# Patient Record
Sex: Female | Born: 1955 | Race: White | Hispanic: No | Marital: Married | Smoking: Former smoker
Health system: Southern US, Community
[De-identification: ages and names within clinical notes are randomized; demographics above are authoritative.]

## PROBLEM LIST (undated history)

## (undated) DIAGNOSIS — E669 Obesity, unspecified: Secondary | ICD-10-CM

## (undated) DIAGNOSIS — N201 Calculus of ureter: Secondary | ICD-10-CM

## (undated) DIAGNOSIS — E7800 Pure hypercholesterolemia, unspecified: Secondary | ICD-10-CM

## (undated) DIAGNOSIS — K5792 Diverticulitis of intestine, part unspecified, without perforation or abscess without bleeding: Secondary | ICD-10-CM

## (undated) DIAGNOSIS — I350 Nonrheumatic aortic (valve) stenosis: Secondary | ICD-10-CM

## (undated) DIAGNOSIS — E119 Type 2 diabetes mellitus without complications: Secondary | ICD-10-CM

## (undated) HISTORY — DX: Nonrheumatic aortic (valve) stenosis: I35.0

---
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
## 1985-04-27 DIAGNOSIS — E7800 Pure hypercholesterolemia, unspecified: Secondary | ICD-10-CM

## 1985-04-27 HISTORY — DX: Pure hypercholesterolemia, unspecified: E78.00

## 2004-07-24 ENCOUNTER — Other Ambulatory Visit: Admission: RE | Admit: 2004-07-24 | Discharge: 2004-07-24 | Payer: Self-pay | Admitting: Gynecology

## 2005-04-24 ENCOUNTER — Encounter: Admission: RE | Admit: 2005-04-24 | Discharge: 2005-04-24 | Payer: Self-pay | Admitting: Gynecology

## 2005-05-14 ENCOUNTER — Encounter: Admission: RE | Admit: 2005-05-14 | Discharge: 2005-05-14 | Payer: Self-pay | Admitting: Gynecology

## 2005-08-06 ENCOUNTER — Other Ambulatory Visit: Admission: RE | Admit: 2005-08-06 | Discharge: 2005-08-06 | Payer: Self-pay | Admitting: Gynecology

## 2005-11-06 ENCOUNTER — Encounter: Admission: RE | Admit: 2005-11-06 | Discharge: 2005-11-06 | Payer: Self-pay | Admitting: Gynecology

## 2005-11-10 ENCOUNTER — Encounter: Admission: RE | Admit: 2005-11-10 | Discharge: 2005-11-10 | Payer: Self-pay | Admitting: Gynecology

## 2005-11-10 ENCOUNTER — Encounter (INDEPENDENT_AMBULATORY_CARE_PROVIDER_SITE_OTHER): Payer: Self-pay | Admitting: *Deleted

## 2005-11-10 HISTORY — PX: BREAST BIOPSY: SHX20

## 2005-12-10 ENCOUNTER — Encounter: Admission: RE | Admit: 2005-12-10 | Discharge: 2005-12-10 | Payer: Self-pay | Admitting: Gynecology

## 2006-04-27 HISTORY — PX: LAPAROSCOPIC HYSTERECTOMY: SHX1926

## 2006-07-08 ENCOUNTER — Encounter: Admission: RE | Admit: 2006-07-08 | Discharge: 2006-07-08 | Payer: Self-pay | Admitting: Gynecology

## 2006-07-21 ENCOUNTER — Ambulatory Visit: Payer: Self-pay | Admitting: Internal Medicine

## 2006-11-02 ENCOUNTER — Other Ambulatory Visit: Admission: RE | Admit: 2006-11-02 | Discharge: 2006-11-02 | Payer: Self-pay | Admitting: Gynecology

## 2006-11-09 ENCOUNTER — Encounter: Admission: RE | Admit: 2006-11-09 | Discharge: 2006-11-09 | Payer: Self-pay | Admitting: Gynecology

## 2006-11-17 ENCOUNTER — Encounter: Admission: RE | Admit: 2006-11-17 | Discharge: 2006-11-17 | Payer: Self-pay | Admitting: Gynecology

## 2007-02-21 ENCOUNTER — Ambulatory Visit (HOSPITAL_COMMUNITY): Admission: RE | Admit: 2007-02-21 | Discharge: 2007-02-22 | Payer: Self-pay | Admitting: Obstetrics and Gynecology

## 2007-02-21 ENCOUNTER — Encounter (INDEPENDENT_AMBULATORY_CARE_PROVIDER_SITE_OTHER): Payer: Self-pay | Admitting: Obstetrics and Gynecology

## 2007-05-31 ENCOUNTER — Encounter: Admission: RE | Admit: 2007-05-31 | Discharge: 2007-05-31 | Payer: Self-pay | Admitting: Obstetrics and Gynecology

## 2007-11-10 ENCOUNTER — Encounter: Admission: RE | Admit: 2007-11-10 | Discharge: 2007-11-10 | Payer: Self-pay | Admitting: Internal Medicine

## 2008-04-27 DIAGNOSIS — E119 Type 2 diabetes mellitus without complications: Secondary | ICD-10-CM

## 2008-04-27 HISTORY — DX: Type 2 diabetes mellitus without complications: E11.9

## 2009-02-04 ENCOUNTER — Encounter: Admission: RE | Admit: 2009-02-04 | Discharge: 2009-02-04 | Payer: Self-pay | Admitting: Internal Medicine

## 2010-02-05 ENCOUNTER — Encounter: Admission: RE | Admit: 2010-02-05 | Discharge: 2010-02-05 | Payer: Self-pay | Admitting: Internal Medicine

## 2010-05-18 ENCOUNTER — Encounter: Payer: Self-pay | Admitting: Gynecology

## 2010-07-08 ENCOUNTER — Other Ambulatory Visit: Payer: Self-pay | Admitting: Internal Medicine

## 2010-07-08 DIAGNOSIS — R1013 Epigastric pain: Secondary | ICD-10-CM

## 2010-07-08 DIAGNOSIS — R1011 Right upper quadrant pain: Secondary | ICD-10-CM

## 2010-07-09 ENCOUNTER — Ambulatory Visit
Admission: RE | Admit: 2010-07-09 | Discharge: 2010-07-09 | Disposition: A | Discharge status: Home / Self Care | Payer: BC Managed Care – PPO | Source: Ambulatory Visit | Attending: Internal Medicine | Admitting: Internal Medicine

## 2010-07-09 DIAGNOSIS — R1011 Right upper quadrant pain: Secondary | ICD-10-CM

## 2010-07-09 DIAGNOSIS — R1013 Epigastric pain: Secondary | ICD-10-CM

## 2010-09-09 NOTE — Discharge Summary (Signed)
NAMEANALYSSE, QUINONEZ             ACCOUNT NO.:  [id number]   MEDICAL RECORD NO.:  [id number]          PATIENT TYPE:  OIB   LOCATION:  9319                          FACILITY:  [facility]   PHYSICIAN:  Guy Sandifer. Henderson Cloud, M.D. DATE OF BIRTH:  [birth year]   DATE OF ADMISSION:  02/21/2007  DATE OF DISCHARGE:  02/22/2007                               DISCHARGE SUMMARY   ADMITTING DIAGNOSIS:  Endometrial hyperplasia with atypia.   DISCHARGE DIAGNOSIS:  Endometrial hyperplasia with atypia.   PROCEDURE:  On February 21, 2007 laparoscopically-assisted vaginal  hysterectomy with bilateral salpingo-oophorectomy.   REASON FOR ADMISSION:  The patient is a 55 year old, married, white  female, G2, P2, status post tubal ligation with atypical endometrial  hyperplasia on biopsy.  Details are dictated in the history and  physical.  She is admitted for surgical management.   HOSPITAL COURSE:  The patient was admitted to the hospital and undergoes  the above procedure.  Estimated blood loss is 200 mL.  On the evening of  surgery, she has good pain relief.  Vital signs are stable and she is  afebrile with clear urine output.  On the day of discharge she is  passing flatus, tolerating a regular diet, voiding, ambulating.  Vital  signs are stable and she is afebrile.  Hemoglobin is 10.2.   CONDITION ON DISCHARGE:  Good.   DIET:  Regular as tolerated.   ACTIVITY:  No lifting, no operation of automobiles, no vaginal entry.  She is to call the office for problems including not limited to  increasing pain, persistent nausea or vomiting, heavy bleeding or  temperature of 101 degrees.   MEDICATIONS:  1. Percocet 5/325 mg #40 1-2 p.o. q.6 h p.r.n.  2. Ibuprofen 600 mg q.6 h p.r.n.  3. Multivitamin daily.   FOLLOW-UP:  In the office in 2 weeks.      Guy Sandifer Henderson Cloud, M.D.  Electronically Signed     JET/MEDQ  D:  02/22/2007  T:  02/22/2007  Job:  [id number]

## 2010-09-09 NOTE — H&P (Signed)
NAMECASSARA, [name]             ACCOUNT NO.:  [id number]   MEDICAL RECORD NO.:  [id number]          PATIENT TYPE:  AMB   LOCATION:  SDC                           FACILITY:  [facility]   PHYSICIAN:  Guy Sandifer. Henderson Cloud, M.D. DATE OF BIRTH:  July 09, 1955   DATE OF ADMISSION:  02/21/2007  DATE OF DISCHARGE:                              HISTORY & PHYSICAL   CHIEF COMPLAINT:  Endometrial hyperplasia with atypia.   HISTORY OF PRESENT ILLNESS:  This patient is a 55 year old married white  female, G2, P2, status post tubal ligation who on recent endometrial  biopsy had hyperplasia with atypia.  Ultrasound done at the time of the  biopsy also revealed a 1.7 cm simple cyst of the right ovary.  Pathology  was consistent with focal complex endometrial hyperplasia with atypia.  After discussion of the options, she is being admitted for  laparoscopically-assisted vaginal hysterectomy and bilateral salpingo-  oophorectomy.  Possible risks and complications have been reviewed  preoperatively.  Issues of menopause have also been reviewed.   PAST MEDICAL HISTORY:  1. Hyperlipidemia.  2. History of yeast infections.  3. History of anemia.   PAST SURGICAL HISTORY:  Tonsillectomy.   OBSTETRIC HISTORY:  Cesarean section x2.   FAMILY HISTORY:  Cancer in mother, chronic hypertension in mother, heart  surgery.   SOCIAL HISTORY:  Ethanol on a social basis.  Denies tobacco or drug  abuse.   MEDICATIONS:  1. Crestor 20 mg daily.  2. Zetia 10 mg daily.   ALLERGIES:  PENICILLIN with a question of leading to rash on one  occasion.  No history of similar reactions before.   REVIEW OF SYSTEMS:  NEURO:  Denies headache.  CARDIAC:  Denies chest  pain.  PULMONARY:  Denies shortness of breath.  GI:  Denies recent  changes in bowel habits.   PHYSICAL EXAM:  Height 5 feet 0 inches, weight 159 pounds, blood  pressure 122/84.  HEENT:  Without thyromegaly.  LUNGS:  Clear to auscultation.  HEART:  Regular rate and  rhythm.  BACK:  Without CVA tenderness.  BREASTS:  Without mass, retraction or discharge.  ABDOMEN:  Soft, nontender without masses.  PELVIC EXAM:  vulva, vagina and cervix without lesion.  Uterus normal  size, mobile, nontender.  Adnexa:  Nontender without masses.  EXTREMITIES:  Grossly within normal limits.  NEUROLOGICAL EXAM:  Grossly within normal limits.   ASSESSMENT:  Focal complex endometrial hyperplasia with atypia.   PLAN:  Laparoscopically-assisted vaginal hysterectomy with bilateral  salpingo-oophorectomy.      Guy Sandifer Henderson Cloud, M.D.  Electronically Signed     JET/MEDQ  D:  55/16/2008  T:  02/11/2007  Job:  [id number]

## 2010-09-09 NOTE — Op Note (Signed)
NAMEALIANY, FIORENZA             ACCOUNT NO.:  [id number]   MEDICAL RECORD NO.:  [id number]          PATIENT TYPE:  AMB   LOCATION:  SDC                           FACILITY:  [facility]   PHYSICIAN:  Guy Sandifer. Henderson Cloud, M.D. DATE OF BIRTH:  12-07-1955   DATE OF PROCEDURE:  02/21/2007  DATE OF DISCHARGE:                               OPERATIVE REPORT   PREOPERATIVE DIAGNOSIS:  Atypical endometrial hyperplasia.   POSTOPERATIVE DIAGNOSIS:  Atypical endometrial hyperplasia.   PROCEDURE:  Laparoscopically-assisted vaginal hysterectomy with  bilateral salpingo-oophorectomy.   SURGEON:  Harold Hedge, MD.   ASSISTANT:  Zelphia Cairo, MD   ANESTHESIA:  General endotracheal intubation.   SPECIMENS:  Uterus, bilateral tubes and ovaries to pathology.   ESTIMATED BLOOD LOSS:  200 mL.   INDICATIONS AND CONSENT:  The patient is a 55 year old married white  female G2, P2, status post tubal ligation with atypical endometrial  hyperplasia.  Details dictated in history and physical.  Laparoscopically-assisted vaginal hysterectomy and removal of both  ovaries is discussed preoperatively.  Potential risks and complications  have been reviewed preoperatively including but not limited to  infection, organ damage, bleeding and transfusion with HIV and hepatitis  risk, DVT, PE, pneumonia, fistula formation, laparotomy, issues of  menopause.  All questions were answered and consent is signed on the  chart.   FINDINGS:  Upper abdomen is grossly normal.  Uterus is normal in size  and contour.  Anterior posterior cul-de-sacs were normal.  Ovaries are  normal bilaterally.   PROCEDURE:  The patient taken to operating room where she is identified,  placed in dorsosupine position and general anesthesia is induced via  endotracheal intubation.  She is then placed in dorsal lithotomy  position where she is prepped abdominally and vaginally bladder straight  catheterized.  Hulka tenaculum was placed in uterus  as a manipulator and  she is draped in sterile fashion.  The infraumbilical and suprapubic  areas injected the midline with 1/2% plain Marcaine.  Small  infraumbilical incision is made.  A disposable Veress needle was placed  on first attempt with a normal syringe and drop test noted.  2 liters of  gas were insufflated under low pressure with good tympany in the right  upper quadrant.  Veress needle is removed and a 10/11 XL bladeless  disposable trocar sleeve was placed using direct visualization with the  diagnostic laparoscope.  After placement, the operative laparoscope was  placed and a small suprapubic incision is made in the midline.  A 5-mm  XL bladeless disposable trocar sleeve is placed under direct  visualization without difficulty.  The above findings noted.  Ureters  were seen to be well clear.  Then using the gyrus bipolar cautery  cutting instrument the right infundibulopelvic ligament is taken down.  It is taken across the mesosalpinx, across the round ligament and down  to the level vesicouterine peritoneum.  Good hemostasis is maintained.  Similar procedure is carried out on the left side.  The bladder flap is  taken down cephalolaterally.  The suprapubic trocar sleeve was removed.  Instruments are removed.  Attention is  turned attention is turned to the  vagina.  Posterior cul-de-sac is entered sharply and the cervix was  circumscribed with unipolar cautery.  Mucosa is advanced sharply and  bluntly.  Then using the gyrus bipolar cautery instrument the  uterosacral ligaments are taken down followed by the bladder pillars,  cardinal ligaments and uterine vessels bilaterally.  Anterior cul-de-sac  is entered without difficulty.  Fundus is delivered posteriorly and the  specimen is fully delivered.  Uterosacral ligaments are plicated the  vaginal cuff bilaterally with separate sutures of 0 Monocryl.  All  suture be 0 Monocryl unless otherwise designated.  Uterosacral  ligaments  then plicated midline with a third suture.  Cuff was closed with figure-  of-eights.  Foley catheter is placed in the bladder and clear urine is  noted.  Attention is returned to the abdomen.  After the  pneumoperitoneum was recreated, the suprapubic trocar sleeve is  reinserted under direct visualization.  Careful inspection and  irrigation reveals excellent hemostasis all around.  Excess fluid is  removed and inspection again under reduced pneumoperitoneum reveals  continued hemostasis.  All instruments are removed.  Incisions were  closed with Dermabond.  All counts were correct.  The patient is  awakened, taken to recovery room in stable condition.      Guy Sandifer Henderson Cloud, M.D.  Electronically Signed     JET/MEDQ  D:  02/21/2007  T:  02/21/2007  Job:  [id number]

## 2010-11-27 ENCOUNTER — Other Ambulatory Visit: Payer: Self-pay | Admitting: Gastroenterology

## 2011-01-30 ENCOUNTER — Other Ambulatory Visit: Payer: Self-pay | Admitting: Internal Medicine

## 2011-01-30 DIAGNOSIS — Z1231 Encounter for screening mammogram for malignant neoplasm of breast: Secondary | ICD-10-CM

## 2011-02-04 LAB — CBC
HCT: 29.8 — ABNORMAL LOW
Hemoglobin: 13.3
MCHC: 33
MCV: 85.8
Platelets: 137 — ABNORMAL LOW
RBC: 4.7
WBC: 9.4

## 2011-02-04 LAB — COMPREHENSIVE METABOLIC PANEL
ALT: 30
CO2: 28
Calcium: 10.3
GFR calc non Af Amer: >60
Glucose, Bld: 123 — ABNORMAL HIGH
Sodium: 140
Total Bilirubin: 0.5

## 2011-02-25 ENCOUNTER — Ambulatory Visit
Admission: RE | Admit: 2011-02-25 | Discharge: 2011-02-25 | Disposition: A | Discharge status: Home / Self Care | Payer: BC Managed Care – PPO | Source: Ambulatory Visit | Attending: Internal Medicine | Admitting: Internal Medicine

## 2011-02-25 DIAGNOSIS — Z1231 Encounter for screening mammogram for malignant neoplasm of breast: Secondary | ICD-10-CM

## 2012-01-21 ENCOUNTER — Other Ambulatory Visit: Payer: Self-pay | Admitting: Internal Medicine

## 2012-01-21 DIAGNOSIS — Z1231 Encounter for screening mammogram for malignant neoplasm of breast: Secondary | ICD-10-CM

## 2012-02-26 ENCOUNTER — Ambulatory Visit
Admission: RE | Admit: 2012-02-26 | Discharge: 2012-02-26 | Disposition: A | Discharge status: Home / Self Care | Payer: BC Managed Care – PPO | Source: Ambulatory Visit | Attending: Internal Medicine | Admitting: Internal Medicine

## 2012-02-26 DIAGNOSIS — Z1231 Encounter for screening mammogram for malignant neoplasm of breast: Secondary | ICD-10-CM

## 2013-02-13 ENCOUNTER — Other Ambulatory Visit: Payer: Self-pay

## 2013-02-13 DIAGNOSIS — Z1231 Encounter for screening mammogram for malignant neoplasm of breast: Secondary | ICD-10-CM

## 2013-03-13 ENCOUNTER — Ambulatory Visit
Admission: RE | Admit: 2013-03-13 | Discharge: 2013-03-13 | Disposition: A | Discharge status: Home / Self Care | Payer: BC Managed Care – PPO | Source: Ambulatory Visit

## 2013-03-13 DIAGNOSIS — Z1231 Encounter for screening mammogram for malignant neoplasm of breast: Secondary | ICD-10-CM

## 2014-05-02 ENCOUNTER — Other Ambulatory Visit: Payer: Self-pay

## 2014-05-02 DIAGNOSIS — Z1231 Encounter for screening mammogram for malignant neoplasm of breast: Secondary | ICD-10-CM

## 2014-05-10 ENCOUNTER — Ambulatory Visit
Admission: RE | Admit: 2014-05-10 | Discharge: 2014-05-10 | Disposition: A | Discharge status: Home / Self Care | Payer: BLUE CROSS/BLUE SHIELD | Source: Ambulatory Visit

## 2014-05-10 DIAGNOSIS — Z1231 Encounter for screening mammogram for malignant neoplasm of breast: Secondary | ICD-10-CM

## 2015-04-28 DIAGNOSIS — I10 Essential (primary) hypertension: Secondary | ICD-10-CM

## 2015-04-28 HISTORY — DX: Essential (primary) hypertension: I10

## 2015-06-26 DIAGNOSIS — N201 Calculus of ureter: Secondary | ICD-10-CM

## 2015-06-26 DIAGNOSIS — K5792 Diverticulitis of intestine, part unspecified, without perforation or abscess without bleeding: Secondary | ICD-10-CM

## 2015-06-26 HISTORY — DX: Calculus of ureter: N20.1

## 2015-06-26 HISTORY — DX: Diverticulitis of intestine, part unspecified, without perforation or abscess without bleeding: K57.92

## 2015-06-30 ENCOUNTER — Encounter (HOSPITAL_COMMUNITY): Payer: Self-pay | Admitting: Emergency Medicine

## 2015-06-30 ENCOUNTER — Emergency Department (HOSPITAL_COMMUNITY): Payer: BLUE CROSS/BLUE SHIELD

## 2015-06-30 ENCOUNTER — Inpatient Hospital Stay (HOSPITAL_COMMUNITY)
Admission: EM | Admit: 2015-06-30 | Discharge: 2015-07-03 | DRG: 690 | Disposition: A | Discharge status: Home / Self Care | Payer: BLUE CROSS/BLUE SHIELD | Attending: Internal Medicine | Admitting: Internal Medicine

## 2015-06-30 DIAGNOSIS — Z809 Family history of malignant neoplasm, unspecified: Secondary | ICD-10-CM | POA: Diagnosis not present

## 2015-06-30 DIAGNOSIS — Z833 Family history of diabetes mellitus: Secondary | ICD-10-CM

## 2015-06-30 DIAGNOSIS — Z6835 Body mass index (BMI) 35.0-35.9, adult: Secondary | ICD-10-CM

## 2015-06-30 DIAGNOSIS — N390 Urinary tract infection, site not specified: Principal | ICD-10-CM | POA: Diagnosis present

## 2015-06-30 DIAGNOSIS — Z87891 Personal history of nicotine dependence: Secondary | ICD-10-CM

## 2015-06-30 DIAGNOSIS — E669 Obesity, unspecified: Secondary | ICD-10-CM | POA: Diagnosis present

## 2015-06-30 DIAGNOSIS — Z79899 Other long term (current) drug therapy: Secondary | ICD-10-CM

## 2015-06-30 DIAGNOSIS — K5792 Diverticulitis of intestine, part unspecified, without perforation or abscess without bleeding: Secondary | ICD-10-CM | POA: Diagnosis present

## 2015-06-30 DIAGNOSIS — N1330 Unspecified hydronephrosis: Secondary | ICD-10-CM | POA: Diagnosis present

## 2015-06-30 DIAGNOSIS — K5712 Diverticulitis of small intestine without perforation or abscess without bleeding: Secondary | ICD-10-CM | POA: Diagnosis not present

## 2015-06-30 DIAGNOSIS — N201 Calculus of ureter: Secondary | ICD-10-CM | POA: Diagnosis not present

## 2015-06-30 DIAGNOSIS — E7800 Pure hypercholesterolemia, unspecified: Secondary | ICD-10-CM | POA: Diagnosis present

## 2015-06-30 DIAGNOSIS — E119 Type 2 diabetes mellitus without complications: Secondary | ICD-10-CM | POA: Diagnosis not present

## 2015-06-30 DIAGNOSIS — B9620 Unspecified Escherichia coli [E. coli] as the cause of diseases classified elsewhere: Secondary | ICD-10-CM | POA: Diagnosis present

## 2015-06-30 DIAGNOSIS — N132 Hydronephrosis with renal and ureteral calculous obstruction: Secondary | ICD-10-CM | POA: Diagnosis present

## 2015-06-30 DIAGNOSIS — R109 Unspecified abdominal pain: Secondary | ICD-10-CM | POA: Diagnosis not present

## 2015-06-30 DIAGNOSIS — N179 Acute kidney failure, unspecified: Secondary | ICD-10-CM | POA: Diagnosis present

## 2015-06-30 HISTORY — DX: Obesity, unspecified: E66.9

## 2015-06-30 HISTORY — DX: Diverticulitis of intestine, part unspecified, without perforation or abscess without bleeding: K57.92

## 2015-06-30 HISTORY — DX: Calculus of ureter: N20.1

## 2015-06-30 HISTORY — DX: Type 2 diabetes mellitus without complications: E11.9

## 2015-06-30 HISTORY — DX: Pure hypercholesterolemia, unspecified: E78.00

## 2015-06-30 LAB — COMPREHENSIVE METABOLIC PANEL
ALBUMIN: 4 g/dL (ref 3.5–5.0)
ALK PHOS: 50 U/L (ref 38–126)
ALT: 23 U/L (ref 14–54)
ANION GAP: 8 (ref 5–15)
AST: 23 U/L (ref 15–41)
BUN: 20 mg/dL (ref 6–20)
CO2: 23 mmol/L (ref 22–32)
Calcium: 9.7 mg/dL (ref 8.9–10.3)
Chloride: 105 mmol/L (ref 101–111)
Creatinine, Ser: 1.11 mg/dL — ABNORMAL HIGH (ref 0.44–1.00)
GFR calc Af Amer: >60 mL/min (ref >60)
GFR calc non Af Amer: 53 mL/min — ABNORMAL LOW (ref >60)
GLUCOSE: 196 mg/dL — AB (ref 65–99)
POTASSIUM: 4.1 mmol/L (ref 3.5–5.1)
SODIUM: 136 mmol/L (ref 135–145)
Total Bilirubin: 0.6 mg/dL (ref 0.3–1.2)
Total Protein: 7 g/dL (ref 6.5–8.1)

## 2015-06-30 LAB — URINALYSIS, ROUTINE W REFLEX MICROSCOPIC
BILIRUBIN URINE: NEGATIVE
GLUCOSE, UA: NEGATIVE mg/dL
Ketones, ur: NEGATIVE mg/dL
Nitrite: POSITIVE — AB
PH: 5 (ref 5.0–8.0)
Protein, ur: NEGATIVE mg/dL
SPECIFIC GRAVITY, URINE: 1.023 (ref 1.005–1.030)

## 2015-06-30 LAB — LIPASE, BLOOD: Lipase: 29 U/L (ref 11–51)

## 2015-06-30 LAB — GLUCOSE, CAPILLARY
GLUCOSE-CAPILLARY: 140 mg/dL — AB (ref 65–99)
Glucose-Capillary: 171 mg/dL — ABNORMAL HIGH (ref 65–99)
Glucose-Capillary: 164 mg/dL — ABNORMAL HIGH (ref 65–99)

## 2015-06-30 LAB — CBC
HEMATOCRIT: 36.5 % (ref 36.0–46.0)
HEMOGLOBIN: 12.2 g/dL (ref 12.0–15.0)
MCH: 29 pg (ref 26.0–34.0)
MCHC: 33.4 g/dL (ref 30.0–36.0)
MCV: 86.7 fL (ref 78.0–100.0)
Platelets: 267 10*3/uL (ref 150–400)
RBC: 4.21 MIL/uL (ref 3.87–5.11)
RDW: 14.3 % (ref 11.5–15.5)
WBC: 15.8 10*3/uL — ABNORMAL HIGH (ref 4.0–10.5)

## 2015-06-30 LAB — URINE MICROSCOPIC-ADD ON

## 2015-06-30 MED ORDER — SODIUM CHLORIDE 0.9 % IV BOLUS (SEPSIS)
1000.0000 mL | Freq: Once | INTRAVENOUS | Status: AC
  Administered 2015-06-30: 1000 mL via INTRAVENOUS

## 2015-06-30 MED ORDER — METRONIDAZOLE IN NACL 5-0.79 MG/ML-% IV SOLN
500.0000 mg | Freq: Once | INTRAVENOUS | Status: AC
  Administered 2015-06-30: 500 mg via INTRAVENOUS
  Filled 2015-06-30: qty 100

## 2015-06-30 MED ORDER — ONDANSETRON HCL 4 MG/2ML IJ SOLN
4.0000 mg | Freq: Once | INTRAMUSCULAR | Status: DC
  Filled 2015-06-30: qty 2

## 2015-06-30 MED ORDER — HYDROMORPHONE HCL 1 MG/ML IJ SOLN: 1.0000 mg | INTRAMUSCULAR | Status: AC | PRN

## 2015-06-30 MED ORDER — ACETAMINOPHEN 325 MG PO TABS
650.0000 mg | ORAL_TABLET | Freq: Four times a day (QID) | ORAL | Status: DC | PRN
  Administered 2015-07-01 – 2015-07-03 (×3): 650 mg via ORAL
  Filled 2015-06-30 (×4): qty 2

## 2015-06-30 MED ORDER — CIPROFLOXACIN IN D5W 400 MG/200ML IV SOLN
400.0000 mg | Freq: Once | INTRAVENOUS | Status: AC
  Administered 2015-06-30: 400 mg via INTRAVENOUS
  Filled 2015-06-30: qty 200

## 2015-06-30 MED ORDER — ONDANSETRON HCL 4 MG/2ML IJ SOLN
4.0000 mg | Freq: Once | INTRAMUSCULAR | Status: AC
  Administered 2015-06-30: 4 mg via INTRAVENOUS
  Filled 2015-06-30: qty 2

## 2015-06-30 MED ORDER — MORPHINE SULFATE (PF) 4 MG/ML IV SOLN
4.0000 mg | Freq: Once | INTRAVENOUS | Status: AC
  Administered 2015-06-30: 4 mg via INTRAVENOUS
  Filled 2015-06-30: qty 1

## 2015-06-30 MED ORDER — ACETAMINOPHEN 650 MG RE SUPP
650.0000 mg | Freq: Four times a day (QID) | RECTAL | Status: DC | PRN
  Administered 2015-06-30: 650 mg via RECTAL
  Filled 2015-06-30: qty 1

## 2015-06-30 MED ORDER — DEXTROSE 5 % IV SOLN
1.0000 g | Freq: Once | INTRAVENOUS | Status: DC
  Filled 2015-06-30: qty 10

## 2015-06-30 MED ORDER — MORPHINE SULFATE (PF) 4 MG/ML IV SOLN: 4.0000 mg | Freq: Once | INTRAVENOUS | Status: DC

## 2015-06-30 MED ORDER — DEXTROSE 5 % IV SOLN
1.0000 g | INTRAVENOUS | Status: DC
  Administered 2015-06-30 – 2015-07-02 (×3): 1 g via INTRAVENOUS
  Filled 2015-06-30 (×4): qty 10

## 2015-06-30 MED ORDER — ONDANSETRON HCL 4 MG/2ML IJ SOLN
4.0000 mg | Freq: Four times a day (QID) | INTRAMUSCULAR | Status: DC | PRN
  Administered 2015-06-30: 4 mg via INTRAVENOUS
  Filled 2015-06-30: qty 2

## 2015-06-30 MED ORDER — METRONIDAZOLE IN NACL 5-0.79 MG/ML-% IV SOLN: 500.0000 mg | Freq: Three times a day (TID) | INTRAVENOUS | Status: DC

## 2015-06-30 MED ORDER — CIPROFLOXACIN IN D5W 400 MG/200ML IV SOLN: 400.0000 mg | Freq: Two times a day (BID) | INTRAVENOUS | Status: DC

## 2015-06-30 MED ORDER — IOHEXOL 300 MG/ML  SOLN
100.0000 mL | Freq: Once | INTRAMUSCULAR | Status: AC | PRN
  Administered 2015-06-30: 100 mL via INTRAVENOUS

## 2015-06-30 MED ORDER — INSULIN ASPART 100 UNIT/ML ~~LOC~~ SOLN
0.0000 [IU] | Freq: Three times a day (TID) | SUBCUTANEOUS | Status: DC
  Administered 2015-06-30: 1 [IU] via SUBCUTANEOUS
  Administered 2015-06-30: 2 [IU] via SUBCUTANEOUS
  Administered 2015-07-01 – 2015-07-02 (×4): 1 [IU] via SUBCUTANEOUS
  Administered 2015-07-02 (×2): 2 [IU] via SUBCUTANEOUS
  Administered 2015-07-03: 1 [IU] via SUBCUTANEOUS

## 2015-06-30 MED ORDER — TAMSULOSIN HCL 0.4 MG PO CAPS
0.4000 mg | ORAL_CAPSULE | Freq: Every day | ORAL | Status: DC
  Administered 2015-06-30 – 2015-07-02 (×3): 0.4 mg via ORAL
  Filled 2015-06-30 (×3): qty 1

## 2015-06-30 MED ORDER — MORPHINE SULFATE (PF) 2 MG/ML IV SOLN
1.0000 mg | INTRAVENOUS | Status: DC | PRN
  Administered 2015-06-30 (×2): 1 mg via INTRAVENOUS
  Filled 2015-06-30 (×2): qty 1

## 2015-06-30 MED ORDER — ENOXAPARIN SODIUM 40 MG/0.4ML ~~LOC~~ SOLN
40.0000 mg | Freq: Every day | SUBCUTANEOUS | Status: DC
  Administered 2015-06-30 – 2015-07-02 (×3): 40 mg via SUBCUTANEOUS
  Filled 2015-06-30 (×5): qty 0.4

## 2015-06-30 MED ORDER — ONDANSETRON HCL 4 MG PO TABS: 4.0000 mg | ORAL_TABLET | Freq: Four times a day (QID) | ORAL | Status: DC | PRN

## 2015-06-30 MED ORDER — INSULIN ASPART 100 UNIT/ML ~~LOC~~ SOLN: 0.0000 [IU] | Freq: Every day | SUBCUTANEOUS | Status: DC

## 2015-06-30 MED ORDER — SODIUM CHLORIDE 0.9 % IV SOLN
INTRAVENOUS | Status: DC
Start: 2015-06-30 — End: 2015-07-01
  Administered 2015-06-30: 10:00:00 via INTRAVENOUS

## 2015-06-30 MED ORDER — ONDANSETRON HCL 4 MG/2ML IJ SOLN
4.0000 mg | Freq: Three times a day (TID) | INTRAMUSCULAR | Status: AC | PRN
Start: 2015-06-30 — End: 2015-06-30

## 2015-06-30 MED ORDER — METRONIDAZOLE IN NACL 5-0.79 MG/ML-% IV SOLN
500.0000 mg | Freq: Three times a day (TID) | INTRAVENOUS | Status: DC
  Administered 2015-06-30 – 2015-07-03 (×9): 500 mg via INTRAVENOUS
  Filled 2015-06-30 (×9): qty 100

## 2015-06-30 NOTE — Consult Note (Signed)
Urology Consult  CC: Referring physician: Dr. Wendy Poet Reason for referral: Right distal ureteral stone with hydronephrosis and possible UTI.  History of Present Illness: [name] is a 60 year old female who presented to the emergency room with a 24-hour history of right lower quadrant pain. She indicated that she was having pain in her abdomen on the left-hand side yesterday but that resolved and this morning was having pain in the right lower quadrant. It was constant and did not radiate into the flank or genital region. It is moderate in severity and was not relieved by positional change. She reports that she has never had a kidney stone in the past. She denies any change in her voiding pattern and specifically denies any dysuria or hematuria. She does report she has had UTIs in the past. She has not experienced difficulty with pyelonephritis.    Past Medical History  Diagnosis Date  . High cholesterol   . Diabetes mellitus without complication (HCC)   . Diverticulitis     06/2015  . Ureteral stone     06/2015   Past Surgical History  Procedure Laterality Date  . Cesarean section      Medications:  Scheduled: . cefTRIAXone (ROCEPHIN)  IV  1 g Intravenous Q24H  . enoxaparin (LOVENOX) injection  40 mg Subcutaneous Daily  . insulin aspart  0-5 Units Subcutaneous QHS  . insulin aspart  0-9 Units Subcutaneous TID WC  . metronidazole  500 mg Intravenous Q8H  . ondansetron (ZOFRAN) IV  4 mg Intravenous Once    Allergies:  Allergies  Allergen Reactions  . Penicillins Anaphylaxis    Red blotches     Family History  Problem Relation Age of Onset  . Hyperlipidemia Mother   . High Cholesterol Mother   . Diabetes Mother   . Cancer Mother   . Cancer Father   . Diabetes Sister     Social History:  reports that she has quit smoking. She does not have any smokeless tobacco history on file. She reports that she drinks about 1.2 oz of alcohol per week. She reports that she  does not use illicit drugs.  Review of Systems: Pertinent items are noted in HPI. A comprehensive review of systems was negative except As noted above  Physical Exam:  Vital signs in last 24 hours: Temp:  [99.1 F (37.3 C)] 99.1 F (37.3 C) (03/05 0451) Pulse Rate:  [86-94] 86 (03/05 0728) Resp:  [20] 20 (03/05 0728) BP: (142-145)/(63-69) 142/63 mmHg (03/05 0728) SpO2:  [96 %-97 %] 96 % (03/05 0728) Weight:  [82.555 kg (182 lb)] 82.555 kg (182 lb) (03/05 0459) General appearance: alert and appears stated age Head: Normocephalic, without obvious abnormality, atraumatic Eyes: conjunctivae/corneas clear. EOM's intact.  Oropharynx: moist mucous membranes Neck: supple, symmetrical, trachea midline Resp: normal respiratory effort Cardio: regular rate and rhythm Back: symmetric, no curvature. ROM normal. No CVA tenderness. GI: soft, tender in the right lower quadrent with no peritoneal signs; bowel sounds normal; no masses,  no organomegaly Extremities: extremities normal, atraumatic, no cyanosis or edema Skin: Skin color normal. No visible rashes or lesions Neurologic: Grossly normal  Laboratory Data:   Recent Labs  06/30/15 0508  WBC 15.8*  HGB 12.2  HCT 36.5   BMET  Recent Labs  06/30/15 0508  NA 136  K 4.1  CL 105  CO2 23  GLUCOSE 196*  BUN 20  CREATININE 1.11*  CALCIUM 9.7   No results for input(s): LABPT, INR in the last  72 hours. No results for input(s): LABURIN in the last 72 hours. No results found for this or any previous visit. Creatinine:  Recent Labs  06/30/15 0508  CREATININE 1.11*    Imaging: Ct Abdomen Pelvis W Contrast  06/30/2015  CLINICAL DATA:  Mainly right lower quadrant abdominal pain since 3 a.m. EXAM: CT ABDOMEN AND PELVIS WITH CONTRAST TECHNIQUE: Multidetector CT imaging of the abdomen and pelvis was performed using the standard protocol following bolus administration of intravenous contrast. CONTRAST:  100mL OMNIPAQUE IOHEXOL 300  MG/ML  SOLN COMPARISON:  07/09/2010 ultrasound FINDINGS: Lower chest:  Negative Hepatobiliary: Mild diffuse hepatic steatosis.  Gallbladder normal. Pancreas: Normal Spleen: Normal Adrenals/Urinary Tract: Adrenal glands are normal. Left kidney is normal. The right kidney demonstrates moderate hydronephrosis. There is severe dilatation of the right ureter to a maximal diameter of 14 mm. 1 cm proximal to the ureterovesical junction there is a stone in the distal right ureter. It measures 6 mm. Bladder is negative. Stomach/Bowel: Stomach small bowel and appendix are normal. There is mild diverticulosis of the distal half of the sigmoid colon. The distal sigmoid colon demonstrates pronounced wall thickening and surrounding inflammation. There is no evidence of free air for abscess to indicate perforation. There is trace free fluid left pelvic sidewall. Vascular/Lymphatic: Small pericolonic lymph nodes are seen in the region of the inflamed sigmoid colon measuring up to about 5 mm in short axis. There is atherosclerotic aortoiliac calcification. Reproductive: Uterus is absent.  No pelvic masses. Other: None Musculoskeletal: No acute findings IMPRESSION: There appear to be two coexisting acute abnormalities. There is severe right-sided hydronephrosis due to a stone in the distal right ureter. Additionally, there is moderate to severe inflammatory change involving the distal sigmoid colon over a distance of about 10 cm. This is likely due to diverticulitis, although endoscopic evaluation after appropriate therapy to exclude underlying abnormality would be suggested. Electronically Signed   By: Esperanza Heiraymond  Rubner M.D.   On: 06/30/2015 07:34   CT scan images were independently reviewed  Impression/Assessment:  1. Right distal ureteral stone - the patient has a 4 mm right distal ureteral stone. It is 4 mm in width despite the radiologist reading of maximum diameter of 14 mm which appears to be in error. There is proximal  hydronephrosis. There are no renal calculi noted on the right or left sides. She is having minimal discomfort in the right flank.I noted there is no periureteral or perinephric stranding to suggest an inflammatory process proximal to the stone such as pyelonephritis. Due to the degree of dilation of the collecting system it appears that this stone has likely been present in the distal ureter for some time although I do not appreciate any significant loss of renal parenchyma on the right-hand side. In addition her creatinine at 1.11 is noted to be completely normal. Her white blood cell count is slightly elevated at 15.8 but she appears to have an active inflammatory process occurring in the abdomen. I have discussed with her the fact that her stone has a high probability of spontaneous passage based on its size. She is not having any flank pain and does not appear to be symptomatic from her stone and it is my opinion that her right lower quadrant pain is not due to her distal ureteral stone. We did discuss the need for stent placement if she should have symptoms suggestive of infection proximal to the stone or clinical worsening otherwise. I also have discussed with her that I will begin  her on medical expulsive therapy in order to improve the odds of spontaneous passage of her stone and if it does not pass spontaneously then we will discuss either ureteroscopic management or lithotripsy.  2. Possible UTI: - Her urine had too numerous to count white blood cells and was nitrite positive with 6-30 RBCs and many bacteria although it appeared contaminated with 6-30 squamous cells noted as well. Although she does have a past history of UTIs she reported to me today that she has no symptoms to suggest a UTI. Her urine will need to be cultured and she is being placed on empiric antibiotics.  It is my opinion that her right-sided abdominal pain is due to the inflammatory process involving her distal sigmoid  colon.  Plan:  1. Culture urine.  2. Antibiotics. 3. Medical expulsive therapy using tamsulosin 0.4 mg daily. 4. Will follow and assist as needed. 4. Strain all urine.  Ronetta Molla C 06/30/2015, 8:03 AM

## 2015-06-30 NOTE — ED Provider Notes (Addendum)
CSN: [id number]     Arrival date & time 06/30/15  0445 History   First MD Initiated Contact with Patient 06/30/15 [phone]     Chief Complaint  Patient presents with  . Abdominal Pain     (Consider location/radiation/quality/duration/timing/severity/associated sxs/prior Treatment) Patient is a 60 y.o. female presenting with abdominal pain. The history is provided by the patient.  Abdominal Pain Associated symptoms: no chest pain, no chills, no cough, no diarrhea, no dysuria, no fever, no shortness of breath, no sore throat, no vaginal bleeding, no vaginal discharge and no vomiting   Patient c/o right abd pain onset this AM. States yesterday had a pain mid to left abd. That pain is somewhat improved today. todays pain right abd, acute onset, constant, moderate, non radiating. No specific exacerbating or alleviating factors. Had normal appetite yesterday. Mild nausea now. No vomiting. No diarrhea.  Remote hx c section, no other abd surgery. Denies hx gallstones. No dysuria or hematuria. No back or flank pain. Denies fever or chills. No cough or chest pain. No sob.      Past Medical History  Diagnosis Date  . High cholesterol   . Diabetes mellitus without complication [facility](HCC)    Past Surgical History  Procedure Laterality Date  . Cesarean section     Family History  Problem Relation Age of Onset  . Hyperlipidemia Mother   . High Cholesterol Mother   . Diabetes Mother   . Cancer Mother   . Cancer Father   . Diabetes Sister    Social History  Substance Use Topics  . Smoking status: Former Games developermoker  . Smokeless tobacco: None  . Alcohol Use: 1.2 oz/week    2 Glasses of wine per week     Comment: on the weekends   OB History    No data available     Review of Systems  Constitutional: Negative for fever and chills.  HENT: Negative for sore throat.   Eyes: Negative for redness.  Respiratory: Negative for cough and shortness of breath.   Cardiovascular: Negative for chest pain and  leg swelling.  Gastrointestinal: Positive for abdominal pain. Negative for vomiting and diarrhea.  Genitourinary: Negative for dysuria, flank pain, vaginal bleeding and vaginal discharge.  Musculoskeletal: Negative for back pain and neck pain.  Skin: Negative for rash.  Neurological: Negative for headaches.  Hematological: Does not bruise/bleed easily.  Psychiatric/Behavioral: Negative for confusion.      Allergies  Penicillins  Home Medications   Prior to Admission medications   Not on File   BP 145/69 mmHg  Pulse 94  Temp(Src) 99.1 F (37.3 C) (Oral)  Resp 20  Ht 5' (1.524 m)  Wt 82.555 kg  BMI 35.54 kg/m2  SpO2 97% Physical Exam  Constitutional: She appears well-developed and well-nourished. No distress.  HENT:  Mouth/Throat: Oropharynx is clear and moist.  Eyes: Conjunctivae are normal. No scleral icterus.  Neck: Neck supple. No tracheal deviation present.  Cardiovascular: Normal rate, regular rhythm, normal heart sounds and intact distal pulses.  Exam reveals no gallop and no friction rub.   No murmur heard. Pulmonary/Chest: Effort normal and breath sounds normal. No respiratory distress.  Abdominal: Soft. Normal appearance and bowel sounds are normal. She exhibits no distension and no mass. There is tenderness. There is no rebound and no guarding.  Left abdominal tenderness. (note pts acute/severe pain is right abd, but minimal right tenderness).     Genitourinary:  No cva tenderness  Musculoskeletal: She exhibits no edema.  Neurological:  She is alert.  Skin: Skin is warm and dry. No rash noted. She is not diaphoretic.  No rash/lesions in area of pain  Psychiatric: She has a normal mood and affect.  Nursing note and vitals reviewed.   ED Course  Procedures (including critical care time) Labs Review  Results for orders placed or performed during the hospital encounter of 06/30/15  Lipase, blood  Result Value Ref Range   Lipase 29 11 - 51 U/L   Comprehensive metabolic panel  Result Value Ref Range   Sodium 136 135 - 145 mmol/L   Potassium 4.1 3.5 - 5.1 mmol/L   Chloride 105 101 - 111 mmol/L   CO2 23 22 - 32 mmol/L   Glucose, Bld 196 (H) 65 - 99 mg/dL   BUN 20 6 - 20 mg/dL   Creatinine, Ser 1.61 (H) 0.44 - 1.00 mg/dL   Calcium 9.7 8.9 - 09.6 mg/dL   Total Protein 7.0 6.5 - 8.1 g/dL   Albumin 4.0 3.5 - 5.0 g/dL   AST 23 15 - 41 U/L   ALT 23 14 - 54 U/L   Alkaline Phosphatase 50 38 - 126 U/L   Total Bilirubin 0.6 0.3 - 1.2 mg/dL   GFR calc non Af Amer 53 (L) >60 mL/min   GFR calc Af Amer >60 >60 mL/min   Anion gap 8 5 - 15  CBC  Result Value Ref Range   WBC 15.8 (H) 4.0 - 10.5 K/uL   RBC 4.21 3.87 - 5.11 MIL/uL   Hemoglobin 12.2 12.0 - 15.0 g/dL   HCT 04.5 40.9 - 81.1 %   MCV 86.7 78.0 - 100.0 fL   MCH 29.0 26.0 - 34.0 pg   MCHC 33.4 30.0 - 36.0 g/dL   RDW 91.4 78.2 - 95.6 %   Platelets 267 150 - 400 K/uL  Urinalysis, Routine w reflex microscopic (not at [facility])  Result Value Ref Range   Color, Urine YELLOW YELLOW   APPearance TURBID (A) CLEAR   Specific Gravity, Urine 1.023 1.005 - 1.030   pH 5.0 5.0 - 8.0   Glucose, UA NEGATIVE NEGATIVE mg/dL   Hgb urine dipstick MODERATE (A) NEGATIVE   Bilirubin Urine NEGATIVE NEGATIVE   Ketones, ur NEGATIVE NEGATIVE mg/dL   Protein, ur NEGATIVE NEGATIVE mg/dL   Nitrite POSITIVE (A) NEGATIVE   Leukocytes, UA LARGE (A) NEGATIVE  Urine microscopic-add on  Result Value Ref Range   Squamous Epithelial / LPF 6-30 (A) NONE SEEN   WBC, UA TOO NUMEROUS TO COUNT 0 - 5 WBC/hpf   RBC / HPF 6-30 0 - 5 RBC/hpf   Bacteria, UA MANY (A) NONE SEEN   Ct Abdomen Pelvis W Contrast  06/30/2015  CLINICAL DATA:  Mainly right lower quadrant abdominal pain since 3 a.m. EXAM: CT ABDOMEN AND PELVIS WITH CONTRAST TECHNIQUE: Multidetector CT imaging of the abdomen and pelvis was performed using the standard protocol following bolus administration of intravenous contrast. CONTRAST:  OMNIPAQUE  IOHEXOL 300 MG/ML  SOLN COMPARISON:  07/09/2010 ultrasound FINDINGS: Lower chest:  Negative Hepatobiliary: Mild diffuse hepatic steatosis.  Gallbladder normal. Pancreas: Normal Spleen: Normal Adrenals/Urinary Tract: Adrenal glands are normal. Left kidney is normal. The right kidney demonstrates moderate hydronephrosis. There is severe dilatation of the right ureter to a maximal diameter of 14 mm. 1 cm proximal to the ureterovesical junction there is a stone in the distal right ureter. It measures 6 mm. Bladder is negative. Stomach/Bowel: Stomach small bowel and appendix are normal. There  is mild diverticulosis of the distal half of the sigmoid colon. The distal sigmoid colon demonstrates pronounced wall thickening and surrounding inflammation. There is no evidence of free air for abscess to indicate perforation. There is trace free fluid left pelvic sidewall. Vascular/Lymphatic: Small pericolonic lymph nodes are seen in the region of the inflamed sigmoid colon measuring up to about 5 mm in short axis. There is atherosclerotic aortoiliac calcification. Reproductive: Uterus is absent.  No pelvic masses. Other: None Musculoskeletal: No acute findings IMPRESSION: There appear to be two coexisting acute abnormalities. There is severe right-sided hydronephrosis due to a stone in the distal right ureter. Additionally, there is moderate to severe inflammatory change involving the distal sigmoid colon over a distance of about 10 cm. This is likely due to diverticulitis, although endoscopic evaluation after appropriate therapy to exclude underlying abnormality would be suggested. Electronically Signed   By: Esperanza Heir M.D.   On: 06/30/2015 07:34       I have personally reviewed and evaluated these images and lab results as part of my medical decision-making.   MDM   Iv ns. Labs.   Morphine for pain. zofran iv.  Ct.  Reviewed nursing notes and prior charts for additional history.   ua positive. u cx  sent.  Although ua is positive, pt demonstrates no real cva tenderness/pain . Given persistent abd pain, moving location, etc, will get imaging.  Pt requests additional pain med. Morphine iv.   Pt indicates only allergy is pcn, and that she had mild red rash to lower legs, which she feels was not a reaction to pcn.  There is no hx anaphylactic rxn.  Ct results noted. ?acute diverticultis, and right ureteral stone w sev hydro. Also w infected stone/uti.  Will rx cipro and flagyl iv.   Urology consulted re uti w ureteral stone. Discussed with Dr Vernie Ammons - he will see/consult.  Medical service contacted for admission. APP indicates place admit/obs temp orders under Dr Lafe Garin.        Cathren Laine, MD 06/30/15 [phone]

## 2015-06-30 NOTE — ED Notes (Signed)
Pt coming from home with ABD pain. ABD pain started approx 3 am yesterday morning. LBM yesterday. Pain went away and then came back. RLQ pain. Denies N/V/D. No appetitie changes. Denies cold and flu symptoms. Pt describes pain as stabbing pain.

## 2015-06-30 NOTE — H&P (Signed)
[facility] History and Physical  [name] ZOX:[id number] DOB: [birth year] DOA: 06/30/2015  Referring physician: [place] Constable PCP: No primary care provider on file. [place]  Chief Complaint: abdominal pain  HPI: [name] is a very pleasant 60 y.o. female 's medical history that includes diabetes diet controlled, high cholesterol, obesity presents to the urgency department with chief complaint of abdominal pain. Initial evaluation reveals diverticulitis as well as right ureteral stone with hydronephrosis.  Information is obtained from the patient. She reports being in her usual state of health until yesterday afternoon she developed left-sided lower quadrant abdominal pain. She describes the pain is sharp intermittent and rates it about a 5 out of 10. At this point she denies any nausea vomiting diarrhea. She denies any fever chills headache dizziness syncope or near-syncope. She reports she is able to tolerate nourishment. He complained of some mild constipation. tHis morning she awakened with right-sided abdominal pain to currently mid quadrants. in addition to the left-sided pain. She describes this pain as a dull ache like constant and rates it about a 7 out of 10. This morning she is also developed some intermittent nausea but denies any vomiting. She denies dysuria hematuria frequency or urgency. She denies fever chills.  In the emergency department max temperature is 99.1 she is hemodynamically stable and not hypoxic. In the emergency department she received IV fluids Cipro and Flagyl as well as morphine for pain.  Review of Systems:  10 point review of systems complete and all systems are negative except as indicated in the history of present illness  Past Medical History  Diagnosis Date  . High cholesterol   . Diabetes mellitus without complication (HCC)   . Diverticulitis     06/2015  . Ureteral stone     06/2015  . Obesity    Past Surgical History  Procedure  Laterality Date  . Cesarean section     Social History:  reports that she has quit smoking. She does not have any smokeless tobacco history on file. She reports that she drinks about 1.2 oz of alcohol per week. She reports that she does not use illicit drugs. She lives at home with her husband has done so for the last 20 years. She is employed full-time as an Geophysicist/field seismologist to a Veterinary surgeon. She drinks 2-3 glasses of wine 3 times a week Allergies  Allergen Reactions  . Penicillins Anaphylaxis    Red blotches     Family History  Problem Relation Age of Onset  . Hyperlipidemia Mother   . High Cholesterol Mother   . Diabetes Mother   . Cancer Mother   . Cancer Father   . Diabetes Sister      Prior to Admission medications   Medication Sig Start Date End Date Taking? Authorizing Provider  aspirin EC 81 MG tablet Take 81 mg by mouth daily.   Yes Historical Provider, MD  calcium carbonate (OSCAL) 1500 (600 Ca) MG TABS tablet Take 1,500 mg by mouth daily with breakfast.   Yes Historical Provider, MD  cholecalciferol (VITAMIN D) 1000 units tablet Take 4,000 Units by mouth daily.   Yes Historical Provider, MD  fenofibrate 160 MG tablet Take 160 mg by mouth daily.   Yes Historical Provider, MD  Lactobacillus (PROBIOTIC ACIDOPHILUS PO) Take 1 tablet by mouth daily.   Yes Historical Provider, MD  Multiple Vitamin (MULTIVITAMIN WITH MINERALS) TABS tablet Take 1 tablet by mouth daily.   Yes Historical Provider, MD  omega-3 acid ethyl esters (LOVAZA) 1  g capsule Take 2 g by mouth daily.   Yes Historical Provider, MD  simvastatin (ZOCOR) 80 MG tablet Take 80 mg by mouth daily.   Yes Historical Provider, MD   Physical Exam: Filed Vitals:   06/30/15 0451 06/30/15 0459 06/30/15 0728 06/30/15 0815  BP: 145/69  142/63 137/79  Pulse: 94  86 87  Temp: 99.1 F (37.3 C)     TempSrc: Oral     Resp: 20  20 20   Height:  5' (1.524 m)    Weight:  82.555 kg (182 lb)    SpO2: 97%  96% 93%    Wt Readings from  Last 3 Encounters:  06/30/15 82.555 kg (182 lb)    General:  Appears calm, Only slightly uncomfortable Eyes: PERRL, normal lids, irises & conjunctiva ENT: grossly normal hearing, lips & tongue mucous membranes of her mouth are pink slightly dry Neck: no LAD, masses or thyromegaly Cardiovascular: Tachycardic but regular no m/r/g. No LE edema.  Respiratory: CTA bilaterally, no w/r/r. Normal respiratory effort. Abdomen: soft, ntnd obese only mild tenderness on right side. Nontender on left no guarding sluggish bowel sounds Skin: no rash or induration seen on limited exam Musculoskeletal: grossly normal tone BUE/BLE Psychiatric: grossly normal mood and affect, speech fluent and appropriate Neurologic: grossly non-focal. Speech clear facial symmetry           Labs on Admission:  Basic Metabolic Panel:  Recent Labs Lab 06/30/15 0508  NA 136  K 4.1  CL 105  CO2 23  GLUCOSE 196*  BUN 20  CREATININE 1.11*  CALCIUM 9.7   Liver Function Tests:  Recent Labs Lab 06/30/15 0508  AST 23  ALT 23  ALKPHOS 50  BILITOT 0.6  PROT 7.0  ALBUMIN 4.0    Recent Labs Lab 06/30/15 0508  LIPASE 29   No results for input(s): AMMONIA in the last 168 hours. CBC:  Recent Labs Lab 06/30/15 0508  WBC 15.8*  HGB 12.2  HCT 36.5  MCV 86.7  PLT 267   Cardiac Enzymes: No results for input(s): CKTOTAL, CKMB, CKMBINDEX, TROPONINI in the last 168 hours.  BNP (last 3 results) No results for input(s): BNP in the last 8760 hours.  ProBNP (last 3 results) No results for input(s): PROBNP in the last 8760 hours.  CBG: No results for input(s): GLUCAP in the last 168 hours.  Radiological Exams on Admission: Ct Abdomen Pelvis W Contrast  06/30/2015  CLINICAL DATA:  Mainly right lower quadrant abdominal pain since 3 a.m. EXAM: CT ABDOMEN AND PELVIS WITH CONTRAST TECHNIQUE: Multidetector CT imaging of the abdomen and pelvis was performed using the standard protocol following bolus  administration of intravenous contrast. CONTRAST:  100mL OMNIPAQUE IOHEXOL 300 MG/ML  SOLN COMPARISON:  07/09/2010 ultrasound FINDINGS: Lower chest:  Negative Hepatobiliary: Mild diffuse hepatic steatosis.  Gallbladder normal. Pancreas: Normal Spleen: Normal Adrenals/Urinary Tract: Adrenal glands are normal. Left kidney is normal. The right kidney demonstrates moderate hydronephrosis. There is severe dilatation of the right ureter to a maximal diameter of 14 mm. 1 cm proximal to the ureterovesical junction there is a stone in the distal right ureter. It measures 6 mm. Bladder is negative. Stomach/Bowel: Stomach small bowel and appendix are normal. There is mild diverticulosis of the distal half of the sigmoid colon. The distal sigmoid colon demonstrates pronounced wall thickening and surrounding inflammation. There is no evidence of free air for abscess to indicate perforation. There is trace free fluid left pelvic sidewall. Vascular/Lymphatic: Small pericolonic lymph nodes  are seen in the region of the inflamed sigmoid colon measuring up to about 5 mm in short axis. There is atherosclerotic aortoiliac calcification. Reproductive: Uterus is absent.  No pelvic masses. Other: None Musculoskeletal: No acute findings IMPRESSION: There appear to be two coexisting acute abnormalities. There is severe right-sided hydronephrosis due to a stone in the distal right ureter. Additionally, there is moderate to severe inflammatory change involving the distal sigmoid colon over a distance of about 10 cm. This is likely due to diverticulitis, although endoscopic evaluation after appropriate therapy to exclude underlying abnormality would be suggested. Electronically Signed   By: Esperanza Heir M.D.   On: 06/30/2015 07:34    EKG: pending  Assessment/Plan Principal Problem:   Abdominal pain Active Problems:   Diabetes mellitus without complication (HCC)   High cholesterol   Diverticulitis   Ureteral stone    Hydronephrosis   UTI (lower urinary tract infection)   Acute kidney injury (HCC)   Obesity  #1. Abdominal pain. Likely related to ureteral stone with hydronephrosis on the right and diverticulitis  moderate sigmoid colon wall thickening and surrounding inflammation per CT. mAx temp 99.1 mildly tachycardic hemodynamically stable and nontoxic appearing -Admit to MedSurg -Rocephin and Flagyl -Nothing by mouth until evaluated by urology -Supportive therapy in the form of analgesia and anti-emetic -Gentle IV fluids -Monitor urine output  #2. UTI -See #1 -Urine culture -Rocephin  #3. Acute kidney injury. Creatinine 1.11 on admission Likely related to above -Hold nephrotoxins -Gentle IV fluids -See #1  #4. Diabetes. Diet controlled. Serum glucose 196 admission. -Nothing by mouth evaluated by urology -Clear liquids when appropriate -Advanced a carb modified -Obtain a hemoglobin A1c -Riding scale insulin for optimal control  #5. High cholesterol. Home medications include Zocor -We'll hold Zocor for now do to above -Obtain a lipid panel  #7. Obesity. BMI 35.6 -Nutritional consult   urology    Code Status: full DVT Prophylaxis: Family Communication: husband at bedside Disposition Plan: home hopefully 48 hours  Time spent: 45 minutes  [facility]

## 2015-06-30 NOTE — Progress Notes (Signed)
PHARMACY - CEFTRIAXONE  A: 60 y/o female admitted with abdominal pain found to have diverticulitis and R ureteral stone with hydronephrosis. Started on Cipro and Flagyl. Pharmacy consulted to change to ceftriaxone for UTI. WBC elevated. SCr 1.11, CrCl ~51 ml/min. UA dirty. UCx pending.  Noted PCN allergy of anaphylaxis - confirmed with patient no SOB, lip/tongue swelling, ONLY rash - updated allergies  Cipro x1 3/5 Flagyl 3/5 >> Ceftriaxone 3/5>>  P: Ceftriaxone 1 g IV q24h Pharmacy signing off as does not need to be renally adjusted  [facility] PetersburgJennifer Independence, 17[address] Rainbow BoulevardPharm.D., BCPS Clinical Pharmacist Pager: [phone] 06/30/2015 9:08 AM

## 2015-06-30 NOTE — ED Notes (Signed)
Called out requesting something for nausea.  Order received and med taken to room.  On arrival to room states it passed can I have some water.  Water provided with okay from physician.

## 2015-06-30 NOTE — ED Notes (Signed)
Taken to CT at this time. 

## 2015-07-01 DIAGNOSIS — E119 Type 2 diabetes mellitus without complications: Secondary | ICD-10-CM

## 2015-07-01 DIAGNOSIS — N201 Calculus of ureter: Secondary | ICD-10-CM

## 2015-07-01 DIAGNOSIS — K5792 Diverticulitis of intestine, part unspecified, without perforation or abscess without bleeding: Secondary | ICD-10-CM

## 2015-07-01 DIAGNOSIS — K5712 Diverticulitis of small intestine without perforation or abscess without bleeding: Secondary | ICD-10-CM

## 2015-07-01 LAB — GLUCOSE, CAPILLARY
GLUCOSE-CAPILLARY: 150 mg/dL — AB (ref 65–99)
Glucose-Capillary: 143 mg/dL — ABNORMAL HIGH (ref 65–99)
Glucose-Capillary: 147 mg/dL — ABNORMAL HIGH (ref 65–99)
Glucose-Capillary: 146 mg/dL — ABNORMAL HIGH (ref 65–99)

## 2015-07-01 LAB — BASIC METABOLIC PANEL
Anion gap: 9 (ref 5–15)
BUN: 18 mg/dL (ref 6–20)
CHLORIDE: 109 mmol/L (ref 101–111)
CO2: 22 mmol/L (ref 22–32)
Calcium: 8.6 mg/dL — ABNORMAL LOW (ref 8.9–10.3)
Creatinine, Ser: 1.49 mg/dL — ABNORMAL HIGH (ref 0.44–1.00)
GFR calc Af Amer: 43 mL/min — ABNORMAL LOW (ref >60)
GFR, EST NON AFRICAN AMERICAN: 37 mL/min — AB (ref >60)
GLUCOSE: 142 mg/dL — AB (ref 65–99)
POTASSIUM: 3.8 mmol/L (ref 3.5–5.1)
Sodium: 140 mmol/L (ref 135–145)

## 2015-07-01 LAB — CBC
HEMATOCRIT: 30.2 % — AB (ref 36.0–46.0)
HEMOGLOBIN: 10 g/dL — AB (ref 12.0–15.0)
MCH: 29 pg (ref 26.0–34.0)
MCHC: 33.1 g/dL (ref 30.0–36.0)
MCV: 87.5 fL (ref 78.0–100.0)
Platelets: 185 10*3/uL (ref 150–400)
RBC: 3.45 MIL/uL — ABNORMAL LOW (ref 3.87–5.11)
RDW: 14.8 % (ref 11.5–15.5)
WBC: 17.2 10*3/uL — ABNORMAL HIGH (ref 4.0–10.5)

## 2015-07-01 LAB — HEMOGLOBIN A1C
Hgb A1c MFr Bld: 7.5 % — ABNORMAL HIGH (ref 4.8–5.6)
Mean Plasma Glucose: 169 mg/dL

## 2015-07-01 MED ORDER — FLORA-Q PO CAPS
ORAL_CAPSULE | Freq: Every day | ORAL | Status: DC
  Filled 2015-07-01: qty 1

## 2015-07-01 MED ORDER — FENOFIBRATE 160 MG PO TABS
160.0000 mg | ORAL_TABLET | Freq: Every day | ORAL | Status: DC
  Administered 2015-07-01 – 2015-07-03 (×3): 160 mg via ORAL
  Filled 2015-07-01 (×3): qty 1

## 2015-07-01 MED ORDER — ATORVASTATIN CALCIUM 40 MG PO TABS
40.0000 mg | ORAL_TABLET | Freq: Every day | ORAL | Status: DC
  Administered 2015-07-01 – 2015-07-02 (×2): 40 mg via ORAL
  Filled 2015-07-01 (×2): qty 1

## 2015-07-01 MED ORDER — SODIUM CHLORIDE 0.9 % IV SOLN
INTRAVENOUS | Status: DC
  Administered 2015-07-01: 1000 mL via INTRAVENOUS
  Administered 2015-07-02: 75 mL/h via INTRAVENOUS

## 2015-07-01 MED ORDER — SACCHAROMYCES BOULARDII 250 MG PO CAPS
250.0000 mg | ORAL_CAPSULE | Freq: Two times a day (BID) | ORAL | Status: DC
  Administered 2015-07-01 – 2015-07-03 (×5): 250 mg via ORAL
  Filled 2015-07-01 (×5): qty 1

## 2015-07-01 MED ORDER — RISAQUAD PO CAPS
1.0000 | ORAL_CAPSULE | Freq: Every day | ORAL | Status: DC
  Administered 2015-07-01 – 2015-07-03 (×3): 1 via ORAL
  Filled 2015-07-01 (×4): qty 1

## 2015-07-01 NOTE — Progress Notes (Signed)
Patient ID: [name], female   DOB: 1956-01-11, 60 y.o.   MRN: [id number]  Subjective: Patient reports that her right lower quadrant pain has nearly resolved. She said she is now feeling a little discomfort in her right flank but it is not severe in any way. She has noted some increased urinary frequency but is on intravenous fluids. She has not seen her stone pass and indicates that her urine has not been strained as I had ordered. She is not having any fever or chills. She said overall she feels much better. She said there was rather sudden improvement in her pain yesterday afternoon.  Objective: Vital signs in last 24 hours: Temp:  [98.3 F (36.8 C)-103 F (39.4 C)] 98.3 F (36.8 C) (03/06 0533) Pulse Rate:  [81-109] 88 (03/06 0533) Resp:  [16-20] 16 (03/06 0533) BP: (94-137)/(37-79) 112/62 mmHg (03/06 0533) SpO2:  [93 %-98 %] 94 % (03/06 0533) Weight:  [84.1 kg (185 lb 6.5 oz)] 84.1 kg (185 lb 6.5 oz) (03/05 0859)A  Intake/Output from previous day: 03/05 0701 - 03/06 0700 In: 927.5 [I.V.:677.5; IV Piggyback:250] Out: -  Intake/Output this shift:    Past Medical History  Diagnosis Date  . High cholesterol   . Diabetes mellitus without complication (HCC)   . Diverticulitis     06/2015  . Ureteral stone     06/2015  . Obesity     Physical Exam:  Lungs - Normal respiratory effort, chest expands symmetrically.  Abdomen - Soft, Only slightly tender in the right lower quadrant with no peritoneal signs & non-distended. She has no CVAT on the right-hand side.  Lab Results:  Recent Labs  06/30/15 0508 07/01/15 0528  WBC 15.8* 17.2*  HGB 12.2 10.0*  HCT 36.5 30.2*   BMET  Recent Labs  06/30/15 0508 07/01/15 0528  NA 136 140  K 4.1 3.8  CL 105 109  CO2 23 22  GLUCOSE 196* 142*  BUN 20 18  CREATININE 1.11* 1.49*  CALCIUM 9.7 8.6*   No results for input(s): LABURIN in the last 72 hours. No results found for this or any previous  visit.  Studies/Results: No results found.  Assessment: Clinically she has experienced marked improvement. Her pain has improved significantly and she feels much better overall. She is having some irritative voiding symptoms primarily frequency which could be due to the stone in her distal ureter having moved somewhat closer to the bladder but also could be secondary to the intravenous fluids she has been receiving. Her urine has not been strained as I ordered so it is possible she may have passed her stone although for now I will continue to monitor.  Plan: 1. Strain urine. 2. Continue medical expulsive therapy with tamsulosin. 3. We will have her follow-up as an outpatient for further imaging and management of her stone if it persists at that time.  [name] C 07/01/2015, 7:31 AM

## 2015-07-01 NOTE — Consult Note (Signed)
Referring Provider:  Dr. Susa Raring Primary Care Physician:  Dr. Lonzo Cloud Primary Gastroenterologist:  Dr. Dulce Sellar  Reason for Consultation:  Diverticulitis  HPI: [name] is a 60 y.o. female admitted to the hospital yesterday following a one-day history of significant lower abdominal pain which began in the left lower quadrant and then migrated to the right lower quadrant. The pain is doing better at this time. CT scan on admission showed evidence of 2 simultaneous acute abnormalities, sigmoid diverticulitis and right-sided hydronephrosis due to a stone.  Of note, the patient has never previously had either diverticulitis or a kidney stone, and her colonoscopy for screening in August 2012 (Dr. Willis Modena) did not make any mention of diverticulosis, although the prep was suboptimal. Nonetheless, the CT scan shows appears to be mild distal sigmoid diverticular change.  The patient is on Rocephin and Flagyl, and has been afebrile and without need for pain medication for about 24 hours. However, despite this clinical improvement, there has been a slight rise in the patient's white count overnight, from 15.8 thousand to a current level of 17.2 thousand. For that reason, we were asked to see the patient.   Past Medical History  Diagnosis Date  . High cholesterol   . Diabetes mellitus without complication (HCC)   . Diverticulitis     06/2015  . Ureteral stone     06/2015  . Obesity     Past Surgical History  Procedure Laterality Date  . Cesarean section      Prior to Admission medications   Medication Sig Start Date End Date Taking? Authorizing Provider  aspirin EC 81 MG tablet Take 81 mg by mouth daily.   Yes Historical Provider, MD  calcium carbonate (OSCAL) 1500 (600 Ca) MG TABS tablet Take 1,500 mg by mouth daily with breakfast.   Yes Historical Provider, MD  cholecalciferol (VITAMIN D) 1000 units tablet Take 4,000 Units by mouth daily.   Yes Historical Provider, MD   fenofibrate 160 MG tablet Take 160 mg by mouth daily.   Yes Historical Provider, MD  Lactobacillus (PROBIOTIC ACIDOPHILUS PO) Take 1 tablet by mouth daily.   Yes Historical Provider, MD  Multiple Vitamin (MULTIVITAMIN WITH MINERALS) TABS tablet Take 1 tablet by mouth daily.   Yes Historical Provider, MD  omega-3 acid ethyl esters (LOVAZA) 1 g capsule Take 2 g by mouth daily.   Yes Historical Provider, MD  simvastatin (ZOCOR) 80 MG tablet Take 80 mg by mouth daily.   Yes Historical Provider, MD    Current Facility-Administered Medications  Medication Dose Route Frequency Provider Last Rate Last Dose  . 0.9 %  sodium chloride infusion   Intravenous Continuous Leroy Sea, MD 75 mL/hr at 07/01/15 0802 1,000 mL at 07/01/15 0802  . acetaminophen (TYLENOL) tablet 650 mg  650 mg Oral Q6H PRN Lesle Chris Black, NP      . acidophilus (RISAQUAD) capsule 1 capsule  1 capsule Oral Daily Leroy Sea, MD      . atorvastatin (LIPITOR) tablet 40 mg  40 mg Oral q1800 Leroy Sea, MD      . cefTRIAXone (ROCEPHIN) 1 g in dextrose 5 % 50 mL IVPB  1 g Intravenous Q24H Lynita Lombard [place], RPH   1 g at 07/01/15 1102  . enoxaparin (LOVENOX) injection 40 mg  40 mg Subcutaneous Daily Lesle Chris Black, NP   40 mg at 07/01/15 1102  . fenofibrate tablet 160 mg  160 mg Oral Daily Leroy Sea, MD      .  insulin aspart (novoLOG) injection 0-5 Units  0-5 Units Subcutaneous QHS Gwenyth Bender, NP   0 Units at 06/30/15 2200  . insulin aspart (novoLOG) injection 0-9 Units  0-9 Units Subcutaneous TID WC Gwenyth Bender, NP   1 Units at 07/01/15 1236  . metroNIDAZOLE (FLAGYL) IVPB 500 mg  500 mg Intravenous Q8H Costin Otelia Sergeant, MD   500 mg at 07/01/15 0750  . morphine 2 MG/ML injection 1 mg  1 mg Intravenous Q2H PRN Gwenyth Bender, NP   1 mg at 06/30/15 1303  . ondansetron (ZOFRAN) injection 4 mg  4 mg Intravenous Q6H PRN Gwenyth Bender, NP   4 mg at 06/30/15 1145  . tamsulosin (FLOMAX) capsule 0.4 mg  0.4 mg Oral QPC  supper Ihor Gully, MD   0.4 mg at 06/30/15 1757    Allergies as of 06/30/2015 - Review Complete 06/30/2015  Allergen Reaction Noted  . Penicillins Rash 06/30/2015    Family History  Problem Relation Age of Onset  . Hyperlipidemia Mother   . High Cholesterol Mother   . Diabetes Mother   . Cancer Mother   . Cancer Father   . Diabetes Sister     Social History   Social History  . Marital Status: Married    Spouse Name: N/A  . Number of Children: N/A  . Years of Education: N/A   Occupational History  . Not on file.   Social History Main Topics  . Smoking status: Former Games developer  . Smokeless tobacco: Not on file  . Alcohol Use: 1.2 oz/week    2 Glasses of wine per week     Comment: on the weekends  . Drug Use: No  . Sexual Activity: Not on file   Other Topics Concern  . Not on file   Social History Narrative  . No narrative on file    Review of Systems: No problem with chronic constipation. No family history of diverticulitis.  Physical Exam: Vital signs in last 24 hours: Temp:  [98.3 F (36.8 C)-101.1 F (38.4 C)] 98.3 F (36.8 C) (03/06 0533) Pulse Rate:  [85-88] 88 (03/06 0533) Resp:  [16-18] 16 (03/06 0533) BP: (106-112)/(52-62) 112/62 mmHg (03/06 0533) SpO2:  [93 %-94 %] 94 % (03/06 0533) Last BM Date: 06/29/15 General:   Alert,  Well-developed, well-nourished, pleasant and cooperative in NAD Head:  Normocephalic and atraumatic. Eyes:  Sclera clear, no icterus.   Conjunctiva pink. Mouth:   No ulcerations or lesions.  Oropharynx pink & moist. Neck:   No masses or thyromegaly. Lungs:  Clear throughout to auscultation.   No wheezes, crackles, or rhonchi. No evident respiratory distress. Heart:   Regular rate and rhythm; no murmurs, clicks, rubs,  or gallops. Abdomen: Remarkably benign. Bowel sounds present, mild tympany in the right mid abdominal and upper abdominal area. No tenderness, no guarding, no mass effect.   Msk:   Symmetrical without gross  deformities. Pulses:  Normal radial pulse is noted. Extremities:   Without clubbing, cyanosis, or edema. Neurologic:  Alert and coherent;  grossly normal neurologically. Skin:  Intact without significant lesions or rashes. Cervical Nodes:  No significant cervical adenopathy. Psych:   Alert and cooperative. Normal mood and affect.  Intake/Output from previous day: 03/05 0701 - 03/06 0700 In: 927.5 [I.V.:677.5; IV Piggyback:250] Out: -  Intake/Output this shift:    Lab Results:  Recent Labs  06/30/15 0508 07/01/15 0528  WBC 15.8* 17.2*  HGB 12.2 10.0*  HCT 36.5 30.2*  PLT 267 185   BMET  Recent Labs  06/30/15 0508 07/01/15 0528  NA 136 140  K 4.1 3.8  CL 105 109  CO2 23 22  GLUCOSE 196* 142*  BUN 20 18  CREATININE 1.11* 1.49*  CALCIUM 9.7 8.6*   LFT  Recent Labs  06/30/15 0508  PROT 7.0  ALBUMIN 4.0  AST 23  ALT 23  ALKPHOS 50  BILITOT 0.6   PT/INR No results for input(s): LABPROT, INR in the last 72 hours.  Studies/Results: Ct Abdomen Pelvis W Contrast  06/30/2015  CLINICAL DATA:  Mainly right lower quadrant abdominal pain since 3 a.m. EXAM: CT ABDOMEN AND PELVIS WITH CONTRAST TECHNIQUE: Multidetector CT imaging of the abdomen and pelvis was performed using the standard protocol following bolus administration of intravenous contrast. CONTRAST:  100mL OMNIPAQUE IOHEXOL 300 MG/ML  SOLN COMPARISON:  07/09/2010 ultrasound FINDINGS: Lower chest:  Negative Hepatobiliary: Mild diffuse hepatic steatosis.  Gallbladder normal. Pancreas: Normal Spleen: Normal Adrenals/Urinary Tract: Adrenal glands are normal. Left kidney is normal. The right kidney demonstrates moderate hydronephrosis. There is severe dilatation of the right ureter to a maximal diameter of 14 mm. 1 cm proximal to the ureterovesical junction there is a stone in the distal right ureter. It measures 6 mm. Bladder is negative. Stomach/Bowel: Stomach small bowel and appendix are normal. There is mild  diverticulosis of the distal half of the sigmoid colon. The distal sigmoid colon demonstrates pronounced wall thickening and surrounding inflammation. There is no evidence of free air for abscess to indicate perforation. There is trace free fluid left pelvic sidewall. Vascular/Lymphatic: Small pericolonic lymph nodes are seen in the region of the inflamed sigmoid colon measuring up to about 5 mm in short axis. There is atherosclerotic aortoiliac calcification. Reproductive: Uterus is absent.  No pelvic masses. Other: None Musculoskeletal: No acute findings IMPRESSION: There appear to be two coexisting acute abnormalities. There is severe right-sided hydronephrosis due to a stone in the distal right ureter. Additionally, there is moderate to severe inflammatory change involving the distal sigmoid colon over a distance of about 10 cm. This is likely due to diverticulitis, although endoscopic evaluation after appropriate therapy to exclude underlying abnormality would be suggested. Electronically Signed   By: Esperanza Heiraymond  Rubner M.D.   On: 06/30/2015 07:34    Impression: 1. Uncomplicated, clinically mild to moderate diverticulitis 2. Right-sided hydronephrosis 3. Progressive leukocytosis but with defervescence of fever since time of admission  Plan: 1. Agree with current antibiotic therapy 2. Given absence of diverticulitis complications such as perforation, obstruction, or abscess or fistula formation, I do not feel that Gen. surgical consultation is needed at present 3. Since it has been almost 5 years since the patient's previous colonoscopy, and since diverticulosis was not noted on her previous exam, I do feel that an outpatient colonoscopy, roughly 3-6 weeks following resolution of this episode, would be prudent to exclude alternative problem such as an inflammatory carcinoma (strongly doubt)   LOS: 1 day   Nevin Kozuch V  07/01/2015, 1:46 PM   Pager [phone] If no answer or after 5 PM call  [phone]

## 2015-07-01 NOTE — Progress Notes (Signed)
Patient Demographics:    [name], is a 60 y.o. female, DOB - 10-18-1955, ZOX:[id number]  Admit date - 06/30/2015   Admitting Physician Costin Otelia Sergeant, MD  Outpatient Primary MD for the patient is No primary care provider on file.  LOS - 1   Chief Complaint  Patient presents with  . Abdominal Pain        Subjective:    [name] today has, No headache, No chest pain, Much improved lower abdominal pain - No Nausea, No new weakness tingling or numbness, No Cough - SOB.     Assessment  & Plan :     1.Acute renal failure with right-sided distal ureter stone and hydronephrosis along with UTI - urology on board, currently patient getting IV fluids for urine straining, on Flomax, continue empiric Rocephin. Monitor cultures. She clinically feels better however renal option is slightly worse. We'll defer further management to urology.  2. Acute diverticulitis. Abdominal pain has improved, clear liquids, and tinea Rocephin and Flagyl, GI requested to evaluate. She has seen [facility] in the past.  3. Dyslipidemia. Placed on home dose Zocor and fenofibrate.  4. Obesity. Outpatient follow-up with PCP for weight reduction.   5. Diet Controlled type 2 diabetes mellitus. NovoLog Sliding scale for now.  Lab Results  Component Value Date   HGBA1C 7.5* 06/30/2015   CBG (last 3)   Recent Labs  06/30/15 1706 06/30/15 2054 07/01/15 0757  GLUCAP 140* 164* 143*      Code Status : Full  Family Communication  : None present  Disposition Plan  : Stay inpatient  Consults  : Urology, GI  Procedures  :   CT scan abdomen and pelvis. Confirming diverticulitis along with right-sided distal ureter stone and hydronephrosis  DVT Prophylaxis  :  Lovenox    Lab Results  Component Value Date     PLT 185 07/01/2015    Inpatient Medications  Scheduled Meds: . cefTRIAXone (ROCEPHIN)  IV  1 g Intravenous Q24H  . enoxaparin (LOVENOX) injection  40 mg Subcutaneous Daily  . insulin aspart  0-5 Units Subcutaneous QHS  . insulin aspart  0-9 Units Subcutaneous TID WC  . metronidazole  500 mg Intravenous Q8H  . tamsulosin  0.4 mg Oral QPC supper   Continuous Infusions: . sodium chloride 1,000 mL (07/01/15 0802)   PRN Meds:.acetaminophen **OR** [DISCONTINUED] acetaminophen, morphine injection, [DISCONTINUED] ondansetron **OR** ondansetron (ZOFRAN) IV  Antibiotics  :     Anti-infectives    Start     Dose/Rate Route Frequency Ordered Stop   06/30/15 2000  ciprofloxacin (CIPRO) IVPB 400 mg  Status:  Discontinued    Comments:  Cipro 400 mg IV q12h for CrCl > 30 mL/min   400 mg 200 mL/hr over 60 Minutes Intravenous 2 times daily 06/30/15 0826 06/30/15 0831   06/30/15 1600  metroNIDAZOLE (FLAGYL) IVPB 500 mg     500 mg 100 mL/hr over 60 Minutes Intravenous Every 8 hours 06/30/15 0826     06/30/15 1000  cefTRIAXone (ROCEPHIN) 1 g in dextrose 5 % 50 mL IVPB     1 g 100 mL/hr over 30 Minutes Intravenous Every 24 hours 06/30/15 0911     06/30/15 0830  ciprofloxacin (CIPRO) IVPB 400 mg  Status:  Discontinued    Comments:  Cipro 400 mg IV q12h for CrCl > 30 mL/min   400 mg 200 mL/hr over 60 Minutes Intravenous 2 times daily 06/30/15 0805 06/30/15 0826   06/30/15 0815  metroNIDAZOLE (FLAGYL) IVPB 500 mg  Status:  Discontinued     500 mg 100 mL/hr over 60 Minutes Intravenous Every 8 hours 06/30/15 0802 06/30/15 0826   06/30/15 0745  cefTRIAXone (ROCEPHIN) 1 g in dextrose 5 % 50 mL IVPB  Status:  Discontinued     1 g 100 mL/hr over 30 Minutes Intravenous  Once 06/30/15 0732 06/30/15 0745   06/30/15 0745  ciprofloxacin (CIPRO) IVPB 400 mg     400 mg 200 mL/hr over 60 Minutes Intravenous  Once 06/30/15 0738 06/30/15 0854   06/30/15 0745  metroNIDAZOLE (FLAGYL) IVPB 500 mg     500 mg 100  mL/hr over 60 Minutes Intravenous  Once 06/30/15 0738 06/30/15 0856        Objective:   Filed Vitals:   06/30/15 1341 06/30/15 1528 06/30/15 2236 07/01/15 0533  BP: 94/37  106/52 112/62  Pulse: 109  85 88  Temp: 103 F (39.4 C) 101.1 F (38.4 C) 98.9 F (37.2 C) 98.3 F (36.8 C)  TempSrc: Oral Oral Oral Oral  Resp: Height:      Weight:      SpO2: 93%  93% 94%    Wt Readings from Last 3 Encounters:  06/30/15 84.1 kg (185 lb 6.5 oz)     Intake/Output Summary (Last 24 hours) at 07/01/15 1130 Last data filed at 07/01/15 0000  Gross per 24 hour  Intake  927.5 ml  Output      0 ml  Net  927.5 ml     Physical Exam  Awake Alert, Oriented X 3, No new F.N deficits, Normal affect [place].AT,PERRAL Supple Neck,No JVD, No cervical lymphadenopathy appriciated.  Symmetrical Chest wall movement, Good air movement bilaterally, CTAB RRR,No Gallops,Rubs or new Murmurs, No Parasternal Heave +ve B.Sounds, Abd Soft, mild lower abddome tenderness, No organomegaly appriciated, No rebound - guarding or rigidity. No Cyanosis, Clubbing or edema, No new Rash or bruise      Data Review:   Micro Results No results found for this or any previous visit (from the past 240 hour(s)).  Radiology Reports Ct Abdomen Pelvis W Contrast  06/30/2015  CLINICAL DATA:  Mainly right lower quadrant abdominal pain since 3 a.m. EXAM: CT ABDOMEN AND PELVIS WITH CONTRAST TECHNIQUE: Multidetector CT imaging of the abdomen and pelvis was performed using the standard protocol following bolus administration of intravenous contrast. CONTRAST:  OMNIPAQUE IOHEXOL 300 MG/ML  SOLN COMPARISON:  07/09/2010 ultrasound FINDINGS: Lower chest:  Negative Hepatobiliary: Mild diffuse hepatic steatosis.  Gallbladder normal. Pancreas: Normal Spleen: Normal Adrenals/Urinary Tract: Adrenal glands are normal. Left kidney is normal. The right kidney demonstrates moderate hydronephrosis. There is severe dilatation of the right  ureter to a maximal diameter of 14 mm. 1 cm proximal to the ureterovesical junction there is a stone in the distal right ureter. It measures 6 mm. Bladder is negative. Stomach/Bowel: Stomach small bowel and appendix are normal. There is mild diverticulosis of the distal half of the sigmoid colon. The distal sigmoid colon demonstrates pronounced wall thickening and surrounding inflammation. There is no evidence of free air for abscess to indicate perforation. There is trace free fluid left pelvic sidewall. Vascular/Lymphatic: Small pericolonic lymph nodes are seen in the region of the inflamed  sigmoid colon measuring up to about 5 mm in short axis. There is atherosclerotic aortoiliac calcification. Reproductive: Uterus is absent.  No pelvic masses. Other: None Musculoskeletal: No acute findings IMPRESSION: There appear to be two coexisting acute abnormalities. There is severe right-sided hydronephrosis due to a stone in the distal right ureter. Additionally, there is moderate to severe inflammatory change involving the distal sigmoid colon over a distance of about 10 cm. This is likely due to diverticulitis, although endoscopic evaluation after appropriate therapy to exclude underlying abnormality would be suggested. Electronically Signed   By: Esperanza Heiraymond  Rubner M.D.   On: 06/30/2015 07:34     CBC  Recent Labs Lab 06/30/15 0508 07/01/15 0528  WBC 15.8* 17.2*  HGB 12.2 10.0*  HCT 36.5 30.2*  PLT 267 185  MCV 86.7 87.5  MCH 29.0 29.0  MCHC 33.4 33.1  RDW 14.3 14.8    Chemistries   Recent Labs Lab 06/30/15 0508 07/01/15 0528  NA 136 140  K 4.1 3.8  CL 105 109  CO2 23 22  GLUCOSE 196* 142*  BUN 20 18  CREATININE 1.11* 1.49*  CALCIUM 9.7 8.6*  AST 23  --   ALT 23  --   ALKPHOS 50  --   BILITOT 0.6  --    ------------------------------------------------------------------------------------------------------------------ No results for input(s): CHOL, HDL, LDLCALC, TRIG, CHOLHDL, LDLDIRECT  in the last 72 hours.  Lab Results  Component Value Date   HGBA1C 7.5* 06/30/2015   ------------------------------------------------------------------------------------------------------------------ No results for input(s): TSH, T4TOTAL, T3FREE, THYROIDAB in the last 72 hours.  Invalid input(s): FREET3 ------------------------------------------------------------------------------------------------------------------ No results for input(s): VITAMINB12, FOLATE, FERRITIN, TIBC, IRON, RETICCTPCT in the last 72 hours.  Coagulation profile No results for input(s): INR, PROTIME in the last 168 hours.  No results for input(s): DDIMER in the last 72 hours.  Cardiac Enzymes No results for input(s): CKMB, TROPONINI, MYOGLOBIN in the last 168 hours.  Invalid input(s): CK ------------------------------------------------------------------------------------------------------------------ No results found for: BNP  Time Spent in minutes 35   SINGH,PRASHANT K M.D on 07/01/2015 at 11:30 AM  Between 7am to 7pm - Pager - [phone]  After 7pm go to [email or web address] - password [email or web address]  [facility] -  Office  [phone]

## 2015-07-02 LAB — BASIC METABOLIC PANEL
Anion gap: 10 (ref 5–15)
BUN: 12 mg/dL (ref 6–20)
CALCIUM: 9 mg/dL (ref 8.9–10.3)
CO2: 23 mmol/L (ref 22–32)
CREATININE: 1.31 mg/dL — AB (ref 0.44–1.00)
Chloride: 108 mmol/L (ref 101–111)
GFR, EST AFRICAN AMERICAN: 51 mL/min — AB (ref >60)
GFR, EST NON AFRICAN AMERICAN: 44 mL/min — AB (ref >60)
Glucose, Bld: 166 mg/dL — ABNORMAL HIGH (ref 65–99)
Potassium: 4 mmol/L (ref 3.5–5.1)
SODIUM: 141 mmol/L (ref 135–145)

## 2015-07-02 LAB — CBC
HCT: 31.2 % — ABNORMAL LOW (ref 36.0–46.0)
Hemoglobin: 9.8 g/dL — ABNORMAL LOW (ref 12.0–15.0)
MCH: 27.4 pg (ref 26.0–34.0)
MCHC: 31.4 g/dL (ref 30.0–36.0)
MCV: 87.2 fL (ref 78.0–100.0)
Platelets: 189 10*3/uL (ref 150–400)
RBC: 3.58 MIL/uL — AB (ref 3.87–5.11)
RDW: 14.4 % (ref 11.5–15.5)
WBC: 12.1 10*3/uL — AB (ref 4.0–10.5)

## 2015-07-02 LAB — URINE CULTURE: Culture: >=100000

## 2015-07-02 LAB — GLUCOSE, CAPILLARY
GLUCOSE-CAPILLARY: 161 mg/dL — AB (ref 65–99)
Glucose-Capillary: 140 mg/dL — ABNORMAL HIGH (ref 65–99)
Glucose-Capillary: 187 mg/dL — ABNORMAL HIGH (ref 65–99)
Glucose-Capillary: 169 mg/dL — ABNORMAL HIGH (ref 65–99)

## 2015-07-02 MED ORDER — ZOLPIDEM TARTRATE 5 MG PO TABS
5.0000 mg | ORAL_TABLET | Freq: Every evening | ORAL | Status: DC | PRN
  Administered 2015-07-02: 5 mg via ORAL
  Filled 2015-07-02: qty 1

## 2015-07-02 MED ORDER — SODIUM CHLORIDE 0.9 % IV SOLN: INTRAVENOUS | Status: AC

## 2015-07-02 NOTE — Progress Notes (Signed)
Patient Demographics:    [name], is a 60 y.o. female, DOB - 1955-10-26, ZOX:[id number]  Admit date - 06/30/2015   Admitting Physician Costin Otelia Sergeant, MD  Outpatient Primary MD for the patient is No primary care provider on file.  LOS - 2   Chief Complaint  Patient presents with  . Abdominal Pain        Subjective:    [name] today has, No headache, No chest pain, Much improved lower abdominal pain, she overall feels better and wants to try some food - No Nausea, No new weakness tingling or numbness, No Cough - SOB.     Assessment  & Plan :     1.Acute renal failure with right-sided distal ureter stone and hydronephrosis along with UTI - urology on board, currently patient getting IV fluids for urine straining, on Flomax, continue empiric Rocephin. Monitor cultures. She clinically feels better, per urology once stable discharge on oral antibiotics with outpatient urology follow-up with Dr. Phoebe Perch.  2. Acute diverticulitis. Abdominal pain has improved, advance to soft diet, continue Rocephin and Flagyl, GI following. Require outpatient colonoscopy with [facility].  3. Dyslipidemia. Placed on home dose Zocor and fenofibrate.  4. Obesity. Outpatient follow-up with PCP for weight reduction.   5. Diet Controlled type 2 diabetes mellitus. NovoLog Sliding scale for now.  Lab Results  Component Value Date   HGBA1C 7.5* 06/30/2015   CBG (last 3)   Recent Labs  07/01/15 1727 07/01/15 2158 07/02/15 0816  GLUCAP 147* 146* 140*      Code Status : Full  Family Communication  : None present  Disposition Plan  : Likely discharge on oral Vantin in the morning  Consults  : Urology, GI  Procedures  :   CT scan abdomen and pelvis. Confirming diverticulitis along with  right-sided distal ureter stone and hydronephrosis  DVT Prophylaxis  :  Lovenox    Lab Results  Component Value Date   PLT 189 07/02/2015    Inpatient Medications  Scheduled Meds: . acidophilus  1 capsule Oral Daily  . atorvastatin  40 mg Oral q1800  . cefTRIAXone (ROCEPHIN)  IV  1 g Intravenous Q24H  . enoxaparin (LOVENOX) injection  40 mg Subcutaneous Daily  . fenofibrate  160 mg Oral Daily  . insulin aspart  0-5 Units Subcutaneous QHS  . insulin aspart  0-9 Units Subcutaneous TID WC  . metronidazole  500 mg Intravenous Q8H  . saccharomyces boulardii  250 mg Oral BID  . tamsulosin  0.4 mg Oral QPC supper   Continuous Infusions: . sodium chloride 800 mL (07/02/15 0908)   PRN Meds:.acetaminophen **OR** [DISCONTINUED] acetaminophen, morphine injection, [DISCONTINUED] ondansetron **OR** ondansetron (ZOFRAN) IV, zolpidem  Antibiotics  :     Anti-infectives    Start     Dose/Rate Route Frequency Ordered Stop   06/30/15 2000  ciprofloxacin (CIPRO) IVPB 400 mg  Status:  Discontinued    Comments:  Cipro 400 mg IV q12h for CrCl > 30 mL/min   400 mg 200 mL/hr over 60 Minutes Intravenous 2 times daily 06/30/15 0826 06/30/15 0831   06/30/15 1600  metroNIDAZOLE (FLAGYL) IVPB 500 mg     500 mg 100 mL/hr over 60 Minutes Intravenous Every 8 hours  06/30/15 0826     06/30/15 1000  cefTRIAXone (ROCEPHIN) 1 g in dextrose 5 % 50 mL IVPB     1 g 100 mL/hr over 30 Minutes Intravenous Every 24 hours 06/30/15 0911     06/30/15 0830  ciprofloxacin (CIPRO) IVPB 400 mg  Status:  Discontinued    Comments:  Cipro 400 mg IV q12h for CrCl > 30 mL/min   400 mg 200 mL/hr over 60 Minutes Intravenous 2 times daily 06/30/15 0805 06/30/15 0826   06/30/15 0815  metroNIDAZOLE (FLAGYL) IVPB 500 mg  Status:  Discontinued     500 mg 100 mL/hr over 60 Minutes Intravenous Every 8 hours 06/30/15 0802 06/30/15 0826   06/30/15 0745  cefTRIAXone (ROCEPHIN) 1 g in dextrose 5 % 50 mL IVPB  Status:  Discontinued      1 g 100 mL/hr over 30 Minutes Intravenous  Once 06/30/15 0732 06/30/15 0745   06/30/15 0745  ciprofloxacin (CIPRO) IVPB 400 mg     400 mg 200 mL/hr over 60 Minutes Intravenous  Once 06/30/15 0738 06/30/15 0854   06/30/15 0745  metroNIDAZOLE (FLAGYL) IVPB 500 mg     500 mg 100 mL/hr over 60 Minutes Intravenous  Once 06/30/15 0738 06/30/15 0856        Objective:   Filed Vitals:   07/01/15 0533 07/01/15 1409 07/01/15 2159 07/02/15 0527  BP: 112/62 137/63 122/61 147/82  Pulse: 88 92 89 92  Temp: 98.3 F (36.8 C) 98.9 F (37.2 C) 98.6 F (37 C) 99.7 F (37.6 C)  TempSrc: Oral Oral Oral Oral  Resp: 16 18 16 16   Height:      Weight:      SpO2: 94% 96% 97% 93%    Wt Readings from Last 3 Encounters:  06/30/15 84.1 kg (185 lb 6.5 oz)     Intake/Output Summary (Last 24 hours) at 07/02/15 1026 Last data filed at 07/02/15 0912  Gross per 24 hour  Intake   2245 ml  Output      0 ml  Net   2245 ml     Physical Exam  Awake Alert, Oriented X 3, No new F.N deficits, Normal affect Braxton.AT,PERRAL Supple Neck,No JVD, No cervical lymphadenopathy appriciated.  Symmetrical Chest wall movement, Good air movement bilaterally, CTAB RRR,No Gallops,Rubs or new Murmurs, No Parasternal Heave +ve B.Sounds, Abd Soft, mild lower abddome tenderness, No organomegaly appriciated, No rebound - guarding or rigidity. No Cyanosis, Clubbing or edema, No new Rash or bruise      Data Review:   Micro Results Recent Results (from the past 240 hour(s))  Urine culture     Status: None   Collection Time: 06/30/15  5:14 AM  Result Value Ref Range Status   Specimen Description URINE, CLEAN CATCH  Final   Special Requests ADDED [id number]  Final   Culture >=100,000 COLONIES/mL ESCHERICHIA COLI  Final   Report Status 07/02/2015 FINAL  Final   Organism ID, Bacteria ESCHERICHIA COLI  Final      Susceptibility   Escherichia coli - MIC*    AMPICILLIN >=32 RESISTANT Resistant     CEFAZOLIN <=4 SENSITIVE  Sensitive     CEFTRIAXONE <=1 SENSITIVE Sensitive     CIPROFLOXACIN >=4 RESISTANT Resistant     GENTAMICIN <=1 SENSITIVE Sensitive     IMIPENEM <=0.25 SENSITIVE Sensitive     NITROFURANTOIN <=16 SENSITIVE Sensitive     TRIMETH/SULFA <=20 SENSITIVE Sensitive     AMPICILLIN/SULBACTAM 16 INTERMEDIATE Intermediate  PIP/TAZO <=4 SENSITIVE Sensitive     * >=100,000 COLONIES/mL ESCHERICHIA COLI    Radiology Reports Ct Abdomen Pelvis W Contrast  06/30/2015  CLINICAL DATA:  Mainly right lower quadrant abdominal pain since 3 a.m. EXAM: CT ABDOMEN AND PELVIS WITH CONTRAST TECHNIQUE: Multidetector CT imaging of the abdomen and pelvis was performed using the standard protocol following bolus administration of intravenous contrast. CONTRAST:  OMNIPAQUE IOHEXOL 300 MG/ML  SOLN COMPARISON:  07/09/2010 ultrasound FINDINGS: Lower chest:  Negative Hepatobiliary: Mild diffuse hepatic steatosis.  Gallbladder normal. Pancreas: Normal Spleen: Normal Adrenals/Urinary Tract: Adrenal glands are normal. Left kidney is normal. The right kidney demonstrates moderate hydronephrosis. There is severe dilatation of the right ureter to a maximal diameter of 14 mm. 1 cm proximal to the ureterovesical junction there is a stone in the distal right ureter. It measures 6 mm. Bladder is negative. Stomach/Bowel: Stomach small bowel and appendix are normal. There is mild diverticulosis of the distal half of the sigmoid colon. The distal sigmoid colon demonstrates pronounced wall thickening and surrounding inflammation. There is no evidence of free air for abscess to indicate perforation. There is trace free fluid left pelvic sidewall. Vascular/Lymphatic: Small pericolonic lymph nodes are seen in the region of the inflamed sigmoid colon measuring up to about 5 mm in short axis. There is atherosclerotic aortoiliac calcification. Reproductive: Uterus is absent.  No pelvic masses. Other: None Musculoskeletal: No acute findings IMPRESSION:  There appear to be two coexisting acute abnormalities. There is severe right-sided hydronephrosis due to a stone in the distal right ureter. Additionally, there is moderate to severe inflammatory change involving the distal sigmoid colon over a distance of about 10 cm. This is likely due to diverticulitis, although endoscopic evaluation after appropriate therapy to exclude underlying abnormality would be suggested. Electronically Signed   By: Esperanza Heir M.D.   On: 06/30/2015 07:34     CBC  Recent Labs Lab 06/30/15 0508 07/01/15 0528 07/02/15 0546  WBC 15.8* 17.2* 12.1*  HGB 12.2 10.0* 9.8*  HCT 36.5 30.2* 31.2*  PLT 267 185 189  MCV 86.7 87.5 87.2  MCH 29.0 29.0 27.4  MCHC 33.4 33.1 31.4  RDW 14.3 14.8 14.4    Chemistries   Recent Labs Lab 06/30/15 0508 07/01/15 0528 07/02/15 0546  NA 136 140 141  K 4.1 3.8 4.0  CL 105 109 108  CO2 GLUCOSE 196* 142* 166*  BUN CREATININE 1.11* 1.49* 1.31*  CALCIUM 9.7 8.6* 9.0  AST 23  --   --   ALT 23  --   --   ALKPHOS 50  --   --   BILITOT 0.6  --   --    ------------------------------------------------------------------------------------------------------------------ No results for input(s): CHOL, HDL, LDLCALC, TRIG, CHOLHDL, LDLDIRECT in the last 72 hours.  Lab Results  Component Value Date   HGBA1C 7.5* 06/30/2015   ------------------------------------------------------------------------------------------------------------------ No results for input(s): TSH, T4TOTAL, T3FREE, THYROIDAB in the last 72 hours.  Invalid input(s): FREET3 ------------------------------------------------------------------------------------------------------------------ No results for input(s): VITAMINB12, FOLATE, FERRITIN, TIBC, IRON, RETICCTPCT in the last 72 hours.  Coagulation profile No results for input(s): INR, PROTIME in the last 168 hours.  No results for input(s): DDIMER in the last 72 hours.  Cardiac  Enzymes No results for input(s): CKMB, TROPONINI, MYOGLOBIN in the last 168 hours.  Invalid input(s): CK ------------------------------------------------------------------------------------------------------------------ No results found for: BNP  Time Spent in minutes 35   Susa Raring K M.D on 07/02/2015 at 10:26 AM  Between 7am to 7pm - Pager - [phone]  After 7pm go to [email or web address] - password [facility]  [facility] -  Office  [phone]

## 2015-07-02 NOTE — Progress Notes (Signed)
GASTROENTEROLOGY PROGRESS NOTE  Problem:   Diverticulitis. Hydronephrosis due to kidney stone.  Subjective: Pain much better.  Some diarrhea yesterday and today.  Ate solid diet this morning w/out problem.  Objective: Afebrile.  Sitting in chair, NAD.  Abd soft, NT.  WBC improved at 12.1   Assessment: Satisfactory response to antbx  Plan: 1.  Ok for dischg from GI standpoint anytime 2   Would complete 7 day course of antbx 3.  Would continue probiotics for at least 2 wks following completion of antbx therapy (pt has her own probiotics at home; ok to use) 4.  Have made f/u appt w/ pt's primary GI (Dr. Dulce Sellarutlaw) for April 3 at 3:30 pm to discuss possible colonoscopy 5. Will sign off, call me prn.  Florencia Reasonsobert V. Tom Macpherson, M.D. 07/02/2015 11:12 AM  Pager [phone] If no answer or after 5 PM call [phone]

## 2015-07-02 NOTE — Progress Notes (Signed)
Patient ID: [name], female   DOB: [birth year], 60 y.o.   MRN: [id number]  Subjective: Patient reports no flank pain and markedly improved right lower quadrant pain.  Overall she feels well.  She said she feels like she may have passed her stone as she remains symptom free.  Objective: Vital signs in last 24 hours: Temp:  [98.6 F (37 C)-99.7 F (37.6 C)] 99.7 F (37.6 C) (03/07 0527) Pulse Rate:  [89-92] 92 (03/07 0527) Resp:  [16-18] 16 (03/07 0527) BP: (122-147)/(61-82) 147/82 mmHg (03/07 0527) SpO2:  [93 %-97 %] 93 % (03/07 0527)A  Intake/Output from previous day: 03/06 0701 - 03/07 0700 In: 918.8 [I.V.:918.8] Out: -  Intake/Output this shift:    Past Medical History  Diagnosis Date  . High cholesterol   . Diabetes mellitus without complication (HCC)   . Diverticulitis     06/2015  . Ureteral stone     06/2015  . Obesity     Physical Exam:  Lungs - Normal respiratory effort, chest expands symmetrically.  No CVAT  Lab Results:  Recent Labs  06/30/15 0508 07/01/15 0528 07/02/15 0546  WBC 15.8* 17.2* 12.1*  HGB 12.2 10.0* 9.8*  HCT 36.5 30.2* 31.2*   BMET  Recent Labs  07/01/15 0528 07/02/15 0546  NA 140 141  K 3.8 4.0  CL 109 108  CO2 22 23  GLUCOSE 142* 166*  BUN 18 12  CREATININE 1.49* 1.31*  CALCIUM 8.6* 9.0   No results for input(s): LABURIN in the last 72 hours. Results for orders placed or performed during the hospital encounter of 06/30/15  Urine culture     Status: None (Preliminary result)   Collection Time: 06/30/15  5:14 AM  Result Value Ref Range Status   Specimen Description URINE, CLEAN CATCH  Final   Special Requests ADDED [id number]  Final   Culture >=100,000 COLONIES/mL ESCHERICHIA COLI  Final   Report Status PENDING  Incomplete    Studies/Results: No results found.  Assessment: 1.  Right ureteral stone - The patient remains free of symptoms.  She has not seen a stone pass.  She will need to remain on medical  expulsive therapy with tamsulosin and follow-up with me as an outpatient.  2.  E. coli UTI - Her culture has grown E. coli.  Sensitivities remain pending however she is responding to antibiotic therapy with no fever and a white count that has begun to fall and is nearly normal now.  Plan: 1.  Continue oral antibiotics based on sensitivities upon discharge. 2.  Continue tamsulosin 0.4 mg on discharge as well. 3.  She will follow-up with me as an outpatient.  Follow-up information placed on chart.  [name] C 07/02/2015, 7:11 AM

## 2015-07-03 DIAGNOSIS — N132 Hydronephrosis with renal and ureteral calculous obstruction: Secondary | ICD-10-CM

## 2015-07-03 DIAGNOSIS — N390 Urinary tract infection, site not specified: Principal | ICD-10-CM

## 2015-07-03 LAB — BASIC METABOLIC PANEL
Anion gap: 5 (ref 5–15)
BUN: 11 mg/dL (ref 6–20)
CALCIUM: 8.8 mg/dL — AB (ref 8.9–10.3)
CHLORIDE: 112 mmol/L — AB (ref 101–111)
CO2: 23 mmol/L (ref 22–32)
CREATININE: 1.23 mg/dL — AB (ref 0.44–1.00)
GFR calc non Af Amer: 47 mL/min — ABNORMAL LOW (ref >60)
GFR, EST AFRICAN AMERICAN: 55 mL/min — AB (ref >60)
Glucose, Bld: 158 mg/dL — ABNORMAL HIGH (ref 65–99)
Potassium: 4 mmol/L (ref 3.5–5.1)
SODIUM: 140 mmol/L (ref 135–145)

## 2015-07-03 LAB — CBC
HEMATOCRIT: 30.2 % — AB (ref 36.0–46.0)
HEMOGLOBIN: 9.6 g/dL — AB (ref 12.0–15.0)
MCH: 27.5 pg (ref 26.0–34.0)
MCHC: 31.8 g/dL (ref 30.0–36.0)
MCV: 86.5 fL (ref 78.0–100.0)
Platelets: 215 10*3/uL (ref 150–400)
RBC: 3.49 MIL/uL — ABNORMAL LOW (ref 3.87–5.11)
RDW: 14.4 % (ref 11.5–15.5)
WBC: 9.1 10*3/uL (ref 4.0–10.5)

## 2015-07-03 LAB — GLUCOSE, CAPILLARY: GLUCOSE-CAPILLARY: 149 mg/dL — AB (ref 65–99)

## 2015-07-03 LAB — MAGNESIUM: Magnesium: 2.1 mg/dL (ref 1.7–2.4)

## 2015-07-03 MED ORDER — CEFPODOXIME PROXETIL 100 MG PO TABS: 100.0000 mg | ORAL_TABLET | Freq: Two times a day (BID) | ORAL | Status: DC

## 2015-07-03 MED ORDER — METRONIDAZOLE 500 MG PO TABS: 500.0000 mg | ORAL_TABLET | Freq: Three times a day (TID) | ORAL | Status: DC

## 2015-07-03 MED ORDER — SACCHAROMYCES BOULARDII 250 MG PO CAPS: 250.0000 mg | ORAL_CAPSULE | Freq: Two times a day (BID) | ORAL | Status: DC

## 2015-07-03 NOTE — Progress Notes (Signed)
Pt given discharge instructions, prescriptions, and care notes. Pt verbalized understanding AEB no further questions or concerns at this time. IV was discontinued, no redness, pain, or swelling noted at this time. Pt left the floor via wheelchair with staff in stable condition. 

## 2015-07-03 NOTE — Discharge Instructions (Signed)
Follow with No primary care provider on file. in 5-7 days ° °Please get a complete blood count and chemistry panel checked by your Primary MD at your next visit, and again as instructed by your Primary MD. Please get your medications reviewed and adjusted by your Primary MD. ° °Please request your Primary MD to go over all Hospital Tests and Procedure/Radiological results at the follow up, please get all Hospital records sent to your Prim MD by signing hospital release before you go home. ° °If you had Pneumonia of Lung problems at the Hospital: °Please get a 2 view Chest X ray done in 6-8 weeks after hospital discharge or sooner if instructed by your Primary MD. ° °If you have Congestive Heart Failure: °Please call your Cardiologist or Primary MD anytime you have any of the following symptoms:  °1) 3 pound weight gain in 24 hours or 5 pounds in 1 week  °2) shortness of breath, with or without a dry hacking cough  °3) swelling in the hands, feet or stomach  °4) if you have to sleep on extra pillows at night in order to breathe ° °Follow cardiac low salt diet and 1.5 lit/day fluid restriction. ° °If you have diabetes °Accuchecks 4 times/day, Once in AM empty stomach and then before each meal. °Log in all results and show them to your primary doctor at your next visit. °If any glucose reading is under 80 or above 300 call your primary MD immediately. ° °If you have Seizure/Convulsions/Epilepsy: °Please do not drive, operate heavy machinery, participate in activities at heights or participate in high speed sports until you have seen by Primary MD or a Neurologist and advised to do so again. ° °If you had Gastrointestinal Bleeding: °Please ask your Primary MD to check a complete blood count within one week of discharge or at your next visit. Your endoscopic/colonoscopic biopsies that are pending at the time of discharge, will also need to followed by your Primary MD. ° °Get Medicines reviewed and adjusted. °Please take  all your medications with you for your next visit with your Primary MD ° °Please request your Primary MD to go over all hospital tests and procedure/radiological results at the follow up, please ask your Primary MD to get all Hospital records sent to his/her office. ° °If you experience worsening of your admission symptoms, develop shortness of breath, life threatening emergency, suicidal or homicidal thoughts you must seek medical attention immediately by calling 911 or calling your MD immediately  if symptoms less severe. ° °You must read complete instructions/literature along with all the possible adverse reactions/side effects for all the Medicines you take and that have been prescribed to you. Take any new Medicines after you have completely understood and accpet all the possible adverse reactions/side effects.  ° °Do not drive or operate heavy machinery when taking Pain medications.  ° °Do not take more than prescribed Pain, Sleep and Anxiety Medications ° °Special Instructions: If you have smoked or chewed Tobacco  in the last 2 yrs please stop smoking, stop any regular Alcohol  and or any Recreational drug use. ° °Wear Seat belts while driving. ° °Please note °You were cared for by a hospitalist during your hospital stay. If you have any questions about your discharge medications or the care you received while you were in the hospital after you are discharged, you can call the unit and asked to speak with the hospitalist on call if the hospitalist that took care of you is   not available. Once you are discharged, your primary care physician will handle any further medical issues. Please note that NO REFILLS for any discharge medications will be authorized once you are discharged, as it is imperative that you return to your primary care physician (or establish a relationship with a primary care physician if you do not have one) for your aftercare needs so that they can reassess your need for medications and  monitor your lab values. ° °You can reach the hospitalist office at phone [phone] or fax [phone] °  °If you do not have a primary care physician, you can call [phone] for a physician referral. ° °Activity: As tolerated with Full fall precautions use walker/cane & assistance as needed ° °Diet: regular ° °Disposition Home ° ° °

## 2015-07-03 NOTE — Discharge Summary (Signed)
Physician Discharge Summary  [name] ZOX:[id number] DOB: [birth year] DOA: 06/30/2015  PCP: No primary care provider on file.  Admit date: 06/30/2015 Discharge date: 07/03/2015  Time spent: > 30 minutes  Recommendations for Outpatient Follow-up:  1. Follow up with Dr. Phoebe Perch in 1-2 weeks 2. Follow up with Dr. Dulce Sellar as scheduled   Discharge Diagnoses:  Principal Problem:   Abdominal pain Active Problems:   Diabetes mellitus without complication (HCC)   High cholesterol   Diverticulitis   Ureteral stone   Hydronephrosis   UTI (lower urinary tract infection)   Acute kidney injury (HCC)   Obesity   Acute diverticulitis  Discharge Condition: stable  Diet recommendation: regular  Filed Weights   06/30/15 0459 06/30/15 0859  Weight: 82.555 kg (182 lb) 84.1 kg (185 lb 6.5 oz)    History of present illness:  See H&P, Labs, Consult and Test reports for all details in brief, patient is a 60 year old female with hyperlipidemia, diet-controlled diabetes, obesity, admitted with left-sided than right-sided abdominal pain, underwent a CT scan of the abdomen and pelvis in the emergency room which showed right-sided hydronephrosis and obstructive stone as well as diverticulitis. Urology and gastroenterology were consulted.  Hospital Course:   Acute renal failure with right-sided distal ureter stone and hydronephrosis along with UTI - urology consulted and have followed patient while hospitalized. Per urology once stable discharge on oral antibiotics with outpatient urology follow-up with Dr. Phoebe Perch. Her urine culture speciated Escherichia coli which was resistant to ciprofloxacin and ampicillin. Given concomitant diverticulitis, and response to ceftriaxone, we'll narrow to cefpodoxime to finish a course for her UTI  Acute diverticulitis - Abdominal pain has improved, she was tolerating a soft diet, gastroenterology cleared the patient for discharge and recommended outpatient follow-up  with Dr. Dulce Sellar to discuss possible colonoscopy as outpatient. She will finish treatment with Vantin and metronidazole. Should mild loose bowel movements, improving on discharge. Dyslipidemia. Placed on home dose Zocor and fenofibrate. Obesity. Outpatient follow-up with PCP for weight reduction.  Diet Controlled type 2 diabetes mellitus.   Procedures:  None    Consultations:  GI  Urology  Discharge Exam: Filed Vitals:   07/02/15 0527 07/02/15 1441 07/02/15 2223 07/03/15 0532  BP: 147/82 147/75 146/69 156/80  Pulse: 92 88 93 78  Temp: 99.7 F (37.6 C) 98.5 F (36.9 C) 100 F (37.8 C) 98.9 F (37.2 C)  TempSrc: Oral Oral Oral Oral  Resp: Height:      Weight:      SpO2: 93% 98% 93% 97%    General: NAD Cardiovascular: RRR Respiratory: CTA biL  Discharge Instructions Activity:  As tolerated   Get Medicines reviewed and adjusted: Please take all your medications with you for your next visit with your Primary MD  Please request your Primary MD to go over all hospital tests and procedure/radiological results at the follow up, please ask your Primary MD to get all Hospital records sent to his/her office.  If you experience worsening of your admission symptoms, develop shortness of breath, life threatening emergency, suicidal or homicidal thoughts you must seek medical attention immediately by calling 911 or calling your MD immediately if symptoms less severe.  You must read complete instructions/literature along with all the possible adverse reactions/side effects for all the Medicines you take and that have been prescribed to you. Take any new Medicines after you have completely understood and accpet all the possible adverse reactions/side effects.   Do not drive when taking  Pain medications.   Do not take more than prescribed Pain, Sleep and Anxiety Medications  Special Instructions: If you have smoked or chewed Tobacco in the last 2 yrs please stop  smoking, stop any regular Alcohol and or any Recreational drug use.  Wear Seat belts while driving.  Please note  You were cared for by a hospitalist during your hospital stay. Once you are discharged, your primary care physician will handle any further medical issues. Please note that NO REFILLS for any discharge medications will be authorized once you are discharged, as it is imperative that you return to your primary care physician (or establish a relationship with a primary care physician if you do not have one) for your aftercare needs so that they can reassess your need for medications and monitor your lab values.    Medication List    TAKE these medications        aspirin EC 81 MG tablet  Take 81 mg by mouth daily.     calcium carbonate 1500 (600 Ca) MG Tabs tablet  Commonly known as:  OSCAL  Take 1,500 mg by mouth daily with breakfast.     cefpodoxime 100 MG tablet  Commonly known as:  VANTIN  Take 1 tablet (100 mg total) by mouth 2 (two) times daily.     cholecalciferol 1000 units tablet  Commonly known as:  VITAMIN D  Take 4,000 Units by mouth daily.     fenofibrate 160 MG tablet  Take 160 mg by mouth daily.     metroNIDAZOLE 500 MG tablet  Commonly known as:  FLAGYL  Take 1 tablet (500 mg total) by mouth 3 (three) times daily.     multivitamin with minerals Tabs tablet  Take 1 tablet by mouth daily.     omega-3 acid ethyl esters 1 g capsule  Commonly known as:  LOVAZA  Take 2 g by mouth daily.     PROBIOTIC ACIDOPHILUS PO  Take 1 tablet by mouth daily.     saccharomyces boulardii 250 MG capsule  Commonly known as:  FLORASTOR  Take 1 capsule (250 mg total) by mouth 2 (two) times daily.     simvastatin 80 MG tablet  Commonly known as:  ZOCOR  Take 80 mg by mouth daily.           Follow-up Information    Follow up with Garnett Farm, MD. Call on 07/10/2015.   Specialty:  Urology   Why:  Appointment with Dr. Vernie Ammons is on 07/10/15 at [facility]  information:   [address] [place] [address] [phone]       Follow up with Freddy Jaksch, MD On 07/29/2015.   Specialty:  Gastroenterology   Why:  Appointment with Dr. Dulce Sellar is on 07/29/15 at 3:30 be there at 3:15   Contact information:   [address] [place] [address] [phone]       The results of significant diagnostics from this hospitalization (including imaging, microbiology, ancillary and laboratory) are listed below for reference.    Significant Diagnostic Studies: Ct Abdomen Pelvis W Contrast  06/30/2015  CLINICAL DATA:  Mainly right lower quadrant abdominal pain since 3 a.m. EXAM: CT ABDOMEN AND PELVIS WITH CONTRAST TECHNIQUE: Multidetector CT imaging of the abdomen and pelvis was performed using the standard protocol following bolus administration of intravenous contrast. CONTRAST:  OMNIPAQUE IOHEXOL 300 MG/ML  SOLN COMPARISON:  07/09/2010 ultrasound FINDINGS: Lower chest:  Negative Hepatobiliary: Mild diffuse  hepatic steatosis.  Gallbladder normal. Pancreas: Normal Spleen: Normal Adrenals/Urinary Tract: Adrenal glands are normal. Left kidney is normal. The right kidney demonstrates moderate hydronephrosis. There is severe dilatation of the right ureter to a maximal diameter of 14 mm. 1 cm proximal to the ureterovesical junction there is a stone in the distal right ureter. It measures 6 mm. Bladder is negative. Stomach/Bowel: Stomach small bowel and appendix are normal. There is mild diverticulosis of the distal half of the sigmoid colon. The distal sigmoid colon demonstrates pronounced wall thickening and surrounding inflammation. There is no evidence of free air for abscess to indicate perforation. There is trace free fluid left pelvic sidewall. Vascular/Lymphatic: Small pericolonic lymph nodes are seen in the region of the inflamed sigmoid colon measuring up to about 5 mm in short axis. There is atherosclerotic aortoiliac calcification.  Reproductive: Uterus is absent.  No pelvic masses. Other: None Musculoskeletal: No acute findings IMPRESSION: There appear to be two coexisting acute abnormalities. There is severe right-sided hydronephrosis due to a stone in the distal right ureter. Additionally, there is moderate to severe inflammatory change involving the distal sigmoid colon over a distance of about 10 cm. This is likely due to diverticulitis, although endoscopic evaluation after appropriate therapy to exclude underlying abnormality would be suggested. Electronically Signed   By: Esperanza Heiraymond  Rubner M.D.   On: 06/30/2015 07:34    Microbiology: Recent Results (from the past 240 hour(s))  Urine culture     Status: None   Collection Time: 06/30/15  5:14 AM  Result Value Ref Range Status   Specimen Description URINE, CLEAN CATCH  Final   Special Requests ADDED [id number]  Final   Culture >=100,000 COLONIES/mL ESCHERICHIA COLI  Final   Report Status 07/02/2015 FINAL  Final   Organism ID, Bacteria ESCHERICHIA COLI  Final      Susceptibility   Escherichia coli - MIC*    AMPICILLIN >=32 RESISTANT Resistant     CEFAZOLIN <=4 SENSITIVE Sensitive     CEFTRIAXONE <=1 SENSITIVE Sensitive     CIPROFLOXACIN >=4 RESISTANT Resistant     GENTAMICIN <=1 SENSITIVE Sensitive     IMIPENEM <=0.25 SENSITIVE Sensitive     NITROFURANTOIN <=16 SENSITIVE Sensitive     TRIMETH/SULFA <=20 SENSITIVE Sensitive     AMPICILLIN/SULBACTAM 16 INTERMEDIATE Intermediate     PIP/TAZO <=4 SENSITIVE Sensitive     * >=100,000 COLONIES/mL ESCHERICHIA COLI     Labs: Basic Metabolic Panel:  Recent Labs Lab 06/30/15 0508 07/01/15 0528 07/02/15 0546 07/03/15 0430  NA 136 140 141 140  K 4.1 3.8 4.0 4.0  CL 105 109 108 112*  CO2 23 22 23 23   GLUCOSE 196* 142* 166* 158*  BUN 20 18 12 11   CREATININE 1.11* 1.49* 1.31* 1.23*  CALCIUM 9.7 8.6* 9.0 8.8*  MG  --   --   --  2.1   Liver Function Tests:  Recent Labs Lab 06/30/15 0508  AST 23  ALT 23    ALKPHOS 50  BILITOT 0.6  PROT 7.0  ALBUMIN 4.0    Recent Labs Lab 06/30/15 0508  LIPASE 29   CBC:  Recent Labs Lab 06/30/15 0508 07/01/15 0528 07/02/15 0546 07/03/15 0430  WBC 15.8* 17.2* 12.1* 9.1  HGB 12.2 10.0* 9.8* 9.6*  HCT 36.5 30.2* 31.2* 30.2*  MCV 86.7 87.5 87.2 86.5  PLT 267 185 189 215   CBG:  Recent Labs Lab 07/02/15 0816 07/02/15 1201 07/02/15 1731 07/02/15 2225 07/03/15 0750  GLUCAP 140* 161*  187* 169* 149*    Signed:  GHERGHE, COSTIN  [facility] 07/03/2015, 3:00 PM

## 2015-07-19 ENCOUNTER — Other Ambulatory Visit: Payer: Self-pay

## 2015-07-19 DIAGNOSIS — Z1231 Encounter for screening mammogram for malignant neoplasm of breast: Secondary | ICD-10-CM

## 2015-08-07 ENCOUNTER — Ambulatory Visit
Admission: RE | Admit: 2015-08-07 | Discharge: 2015-08-07 | Disposition: A | Discharge status: Home / Self Care | Payer: BLUE CROSS/BLUE SHIELD | Source: Ambulatory Visit

## 2015-08-07 DIAGNOSIS — Z1231 Encounter for screening mammogram for malignant neoplasm of breast: Secondary | ICD-10-CM

## 2015-09-16 ENCOUNTER — Other Ambulatory Visit: Payer: Self-pay | Admitting: Gastroenterology

## 2015-12-24 ENCOUNTER — Ambulatory Visit: Payer: BLUE CROSS/BLUE SHIELD | Admitting: Sports Medicine

## 2016-09-14 ENCOUNTER — Other Ambulatory Visit: Payer: Self-pay | Admitting: Family Medicine

## 2016-09-14 DIAGNOSIS — Z1231 Encounter for screening mammogram for malignant neoplasm of breast: Secondary | ICD-10-CM

## 2016-09-15 ENCOUNTER — Ambulatory Visit
Admission: RE | Admit: 2016-09-15 | Discharge: 2016-09-15 | Disposition: A | Discharge status: Home / Self Care | Payer: BLUE CROSS/BLUE SHIELD | Source: Ambulatory Visit | Attending: Family Medicine | Admitting: Family Medicine

## 2016-09-15 DIAGNOSIS — Z1231 Encounter for screening mammogram for malignant neoplasm of breast: Secondary | ICD-10-CM

## 2017-04-29 DIAGNOSIS — H6993 Unspecified Eustachian tube disorder, bilateral: Secondary | ICD-10-CM | POA: Diagnosis not present

## 2017-04-29 DIAGNOSIS — H672 Otitis media in diseases classified elsewhere, left ear: Secondary | ICD-10-CM | POA: Diagnosis not present

## 2017-04-29 DIAGNOSIS — H7292 Unspecified perforation of tympanic membrane, left ear: Secondary | ICD-10-CM | POA: Diagnosis not present

## 2017-05-07 DIAGNOSIS — H6993 Unspecified Eustachian tube disorder, bilateral: Secondary | ICD-10-CM | POA: Diagnosis not present

## 2017-05-07 DIAGNOSIS — H6522 Chronic serous otitis media, left ear: Secondary | ICD-10-CM | POA: Diagnosis not present

## 2017-05-20 DIAGNOSIS — H6993 Unspecified Eustachian tube disorder, bilateral: Secondary | ICD-10-CM | POA: Diagnosis not present

## 2017-05-20 DIAGNOSIS — H6592 Unspecified nonsuppurative otitis media, left ear: Secondary | ICD-10-CM | POA: Diagnosis not present

## 2017-06-21 DIAGNOSIS — H6983 Other specified disorders of Eustachian tube, bilateral: Secondary | ICD-10-CM | POA: Diagnosis not present

## 2017-06-21 DIAGNOSIS — Z9622 Myringotomy tube(s) status: Secondary | ICD-10-CM | POA: Diagnosis not present

## 2017-06-25 DIAGNOSIS — E7800 Pure hypercholesterolemia, unspecified: Secondary | ICD-10-CM | POA: Diagnosis not present

## 2017-06-25 DIAGNOSIS — E119 Type 2 diabetes mellitus without complications: Secondary | ICD-10-CM | POA: Diagnosis not present

## 2017-08-23 DIAGNOSIS — E119 Type 2 diabetes mellitus without complications: Secondary | ICD-10-CM | POA: Diagnosis not present

## 2017-08-23 DIAGNOSIS — H52203 Unspecified astigmatism, bilateral: Secondary | ICD-10-CM | POA: Diagnosis not present

## 2017-09-13 DIAGNOSIS — H6983 Other specified disorders of Eustachian tube, bilateral: Secondary | ICD-10-CM | POA: Diagnosis not present

## 2017-09-13 DIAGNOSIS — Z9622 Myringotomy tube(s) status: Secondary | ICD-10-CM | POA: Diagnosis not present

## 2017-09-13 DIAGNOSIS — H9222 Otorrhagia, left ear: Secondary | ICD-10-CM | POA: Diagnosis not present

## 2017-10-22 ENCOUNTER — Other Ambulatory Visit: Payer: Self-pay | Admitting: Family Medicine

## 2017-10-22 DIAGNOSIS — Z1231 Encounter for screening mammogram for malignant neoplasm of breast: Secondary | ICD-10-CM

## 2017-11-17 ENCOUNTER — Ambulatory Visit
Admission: RE | Admit: 2017-11-17 | Discharge: 2017-11-17 | Disposition: A | Discharge status: Home / Self Care | Payer: BLUE CROSS/BLUE SHIELD | Source: Ambulatory Visit | Attending: Family Medicine | Admitting: Family Medicine

## 2017-11-17 DIAGNOSIS — Z1231 Encounter for screening mammogram for malignant neoplasm of breast: Secondary | ICD-10-CM

## 2018-01-04 DIAGNOSIS — E119 Type 2 diabetes mellitus without complications: Secondary | ICD-10-CM | POA: Diagnosis not present

## 2018-01-04 DIAGNOSIS — Z0000 Encounter for general adult medical examination without abnormal findings: Secondary | ICD-10-CM | POA: Diagnosis not present

## 2018-01-04 DIAGNOSIS — E7800 Pure hypercholesterolemia, unspecified: Secondary | ICD-10-CM | POA: Diagnosis not present

## 2018-04-06 DIAGNOSIS — E119 Type 2 diabetes mellitus without complications: Secondary | ICD-10-CM | POA: Diagnosis not present

## 2018-07-06 DIAGNOSIS — E118 Type 2 diabetes mellitus with unspecified complications: Secondary | ICD-10-CM | POA: Diagnosis not present

## 2018-07-06 DIAGNOSIS — I10 Essential (primary) hypertension: Secondary | ICD-10-CM | POA: Diagnosis not present

## 2018-07-06 DIAGNOSIS — E7800 Pure hypercholesterolemia, unspecified: Secondary | ICD-10-CM | POA: Diagnosis not present

## 2018-07-28 DIAGNOSIS — Z79899 Other long term (current) drug therapy: Secondary | ICD-10-CM | POA: Diagnosis not present

## 2018-10-26 DIAGNOSIS — Z7984 Long term (current) use of oral hypoglycemic drugs: Secondary | ICD-10-CM | POA: Diagnosis not present

## 2018-10-26 DIAGNOSIS — E119 Type 2 diabetes mellitus without complications: Secondary | ICD-10-CM | POA: Diagnosis not present

## 2018-12-05 DIAGNOSIS — K5792 Diverticulitis of intestine, part unspecified, without perforation or abscess without bleeding: Secondary | ICD-10-CM | POA: Diagnosis not present

## 2018-12-06 ENCOUNTER — Other Ambulatory Visit: Payer: Self-pay | Admitting: Family Medicine

## 2018-12-06 DIAGNOSIS — Z1231 Encounter for screening mammogram for malignant neoplasm of breast: Secondary | ICD-10-CM

## 2019-01-19 ENCOUNTER — Ambulatory Visit
Admission: RE | Admit: 2019-01-19 | Discharge: 2019-01-19 | Disposition: A | Discharge status: Home / Self Care | Payer: BC Managed Care – PPO | Source: Ambulatory Visit | Attending: Family Medicine | Admitting: Family Medicine

## 2019-01-19 ENCOUNTER — Other Ambulatory Visit: Payer: Self-pay

## 2019-01-19 DIAGNOSIS — Z1231 Encounter for screening mammogram for malignant neoplasm of breast: Secondary | ICD-10-CM

## 2019-03-02 DIAGNOSIS — Z6835 Body mass index (BMI) 35.0-35.9, adult: Secondary | ICD-10-CM | POA: Diagnosis not present

## 2019-03-02 DIAGNOSIS — M25561 Pain in right knee: Secondary | ICD-10-CM | POA: Diagnosis not present

## 2019-03-02 DIAGNOSIS — Z0000 Encounter for general adult medical examination without abnormal findings: Secondary | ICD-10-CM | POA: Diagnosis not present

## 2019-03-02 DIAGNOSIS — E118 Type 2 diabetes mellitus with unspecified complications: Secondary | ICD-10-CM | POA: Diagnosis not present

## 2019-03-02 DIAGNOSIS — E7800 Pure hypercholesterolemia, unspecified: Secondary | ICD-10-CM | POA: Diagnosis not present

## 2019-05-17 DIAGNOSIS — M9903 Segmental and somatic dysfunction of lumbar region: Secondary | ICD-10-CM | POA: Diagnosis not present

## 2019-05-17 DIAGNOSIS — M9902 Segmental and somatic dysfunction of thoracic region: Secondary | ICD-10-CM | POA: Diagnosis not present

## 2019-05-17 DIAGNOSIS — M545 Low back pain: Secondary | ICD-10-CM | POA: Diagnosis not present

## 2019-05-17 DIAGNOSIS — M9901 Segmental and somatic dysfunction of cervical region: Secondary | ICD-10-CM | POA: Diagnosis not present

## 2019-05-22 DIAGNOSIS — M9903 Segmental and somatic dysfunction of lumbar region: Secondary | ICD-10-CM | POA: Diagnosis not present

## 2019-05-22 DIAGNOSIS — M9901 Segmental and somatic dysfunction of cervical region: Secondary | ICD-10-CM | POA: Diagnosis not present

## 2019-05-22 DIAGNOSIS — M9902 Segmental and somatic dysfunction of thoracic region: Secondary | ICD-10-CM | POA: Diagnosis not present

## 2019-05-22 DIAGNOSIS — M545 Low back pain: Secondary | ICD-10-CM | POA: Diagnosis not present

## 2019-05-30 DIAGNOSIS — M9903 Segmental and somatic dysfunction of lumbar region: Secondary | ICD-10-CM | POA: Diagnosis not present

## 2019-05-30 DIAGNOSIS — M545 Low back pain: Secondary | ICD-10-CM | POA: Diagnosis not present

## 2019-05-30 DIAGNOSIS — M9901 Segmental and somatic dysfunction of cervical region: Secondary | ICD-10-CM | POA: Diagnosis not present

## 2019-05-30 DIAGNOSIS — M9902 Segmental and somatic dysfunction of thoracic region: Secondary | ICD-10-CM | POA: Diagnosis not present

## 2019-06-06 DIAGNOSIS — M9901 Segmental and somatic dysfunction of cervical region: Secondary | ICD-10-CM | POA: Diagnosis not present

## 2019-06-06 DIAGNOSIS — M9903 Segmental and somatic dysfunction of lumbar region: Secondary | ICD-10-CM | POA: Diagnosis not present

## 2019-06-06 DIAGNOSIS — M545 Low back pain: Secondary | ICD-10-CM | POA: Diagnosis not present

## 2019-06-06 DIAGNOSIS — M9902 Segmental and somatic dysfunction of thoracic region: Secondary | ICD-10-CM | POA: Diagnosis not present

## 2019-08-17 DIAGNOSIS — S83241A Other tear of medial meniscus, current injury, right knee, initial encounter: Secondary | ICD-10-CM | POA: Diagnosis not present

## 2019-08-26 DIAGNOSIS — M25561 Pain in right knee: Secondary | ICD-10-CM | POA: Diagnosis not present

## 2019-08-31 ENCOUNTER — Other Ambulatory Visit: Payer: Self-pay | Admitting: Family Medicine

## 2019-08-31 ENCOUNTER — Other Ambulatory Visit: Payer: BC Managed Care – PPO

## 2019-08-31 DIAGNOSIS — R600 Localized edema: Secondary | ICD-10-CM | POA: Diagnosis not present

## 2019-08-31 DIAGNOSIS — E7800 Pure hypercholesterolemia, unspecified: Secondary | ICD-10-CM | POA: Diagnosis not present

## 2019-08-31 DIAGNOSIS — E119 Type 2 diabetes mellitus without complications: Secondary | ICD-10-CM | POA: Diagnosis not present

## 2019-08-31 DIAGNOSIS — M7989 Other specified soft tissue disorders: Secondary | ICD-10-CM | POA: Diagnosis not present

## 2019-08-31 DIAGNOSIS — Z79899 Other long term (current) drug therapy: Secondary | ICD-10-CM | POA: Diagnosis not present

## 2019-08-31 DIAGNOSIS — D649 Anemia, unspecified: Secondary | ICD-10-CM | POA: Diagnosis not present

## 2019-08-31 DIAGNOSIS — I10 Essential (primary) hypertension: Secondary | ICD-10-CM | POA: Diagnosis not present

## 2019-08-31 DIAGNOSIS — S83241D Other tear of medial meniscus, current injury, right knee, subsequent encounter: Secondary | ICD-10-CM | POA: Diagnosis not present

## 2019-11-01 DIAGNOSIS — S83241A Other tear of medial meniscus, current injury, right knee, initial encounter: Secondary | ICD-10-CM | POA: Diagnosis not present

## 2019-11-01 DIAGNOSIS — M94261 Chondromalacia, right knee: Secondary | ICD-10-CM | POA: Diagnosis not present

## 2019-11-01 DIAGNOSIS — S83281A Other tear of lateral meniscus, current injury, right knee, initial encounter: Secondary | ICD-10-CM | POA: Diagnosis not present

## 2019-11-01 DIAGNOSIS — X503XXA Overexertion from repetitive movements, initial encounter: Secondary | ICD-10-CM | POA: Diagnosis not present

## 2019-11-01 DIAGNOSIS — M2341 Loose body in knee, right knee: Secondary | ICD-10-CM | POA: Diagnosis not present

## 2019-11-01 DIAGNOSIS — S83231A Complex tear of medial meniscus, current injury, right knee, initial encounter: Secondary | ICD-10-CM | POA: Diagnosis not present

## 2019-11-01 DIAGNOSIS — G8918 Other acute postprocedural pain: Secondary | ICD-10-CM | POA: Diagnosis not present

## 2019-11-01 DIAGNOSIS — X58XXXA Exposure to other specified factors, initial encounter: Secondary | ICD-10-CM | POA: Diagnosis not present

## 2020-01-03 DIAGNOSIS — H2513 Age-related nuclear cataract, bilateral: Secondary | ICD-10-CM | POA: Diagnosis not present

## 2020-01-03 DIAGNOSIS — H52203 Unspecified astigmatism, bilateral: Secondary | ICD-10-CM | POA: Diagnosis not present

## 2020-01-03 DIAGNOSIS — E119 Type 2 diabetes mellitus without complications: Secondary | ICD-10-CM | POA: Diagnosis not present

## 2020-01-16 DIAGNOSIS — S83241D Other tear of medial meniscus, current injury, right knee, subsequent encounter: Secondary | ICD-10-CM | POA: Diagnosis not present

## 2020-01-16 DIAGNOSIS — S83281D Other tear of lateral meniscus, current injury, right knee, subsequent encounter: Secondary | ICD-10-CM | POA: Diagnosis not present

## 2020-02-22 ENCOUNTER — Other Ambulatory Visit: Payer: Self-pay | Admitting: Family Medicine

## 2020-02-22 DIAGNOSIS — Z1231 Encounter for screening mammogram for malignant neoplasm of breast: Secondary | ICD-10-CM

## 2020-03-05 DIAGNOSIS — D649 Anemia, unspecified: Secondary | ICD-10-CM | POA: Diagnosis not present

## 2020-03-05 DIAGNOSIS — Z79899 Other long term (current) drug therapy: Secondary | ICD-10-CM | POA: Diagnosis not present

## 2020-03-05 DIAGNOSIS — Z0000 Encounter for general adult medical examination without abnormal findings: Secondary | ICD-10-CM | POA: Diagnosis not present

## 2020-03-05 DIAGNOSIS — E119 Type 2 diabetes mellitus without complications: Secondary | ICD-10-CM | POA: Diagnosis not present

## 2020-03-05 DIAGNOSIS — E7800 Pure hypercholesterolemia, unspecified: Secondary | ICD-10-CM | POA: Diagnosis not present

## 2020-03-28 ENCOUNTER — Other Ambulatory Visit: Payer: Self-pay

## 2020-03-28 ENCOUNTER — Ambulatory Visit
Admission: RE | Admit: 2020-03-28 | Discharge: 2020-03-28 | Disposition: A | Discharge status: Home / Self Care | Payer: BC Managed Care – PPO | Source: Ambulatory Visit | Attending: Family Medicine | Admitting: Family Medicine

## 2020-03-28 DIAGNOSIS — Z1231 Encounter for screening mammogram for malignant neoplasm of breast: Secondary | ICD-10-CM | POA: Diagnosis not present

## 2020-03-29 ENCOUNTER — Other Ambulatory Visit: Payer: Self-pay | Admitting: Family Medicine

## 2020-03-29 DIAGNOSIS — R928 Other abnormal and inconclusive findings on diagnostic imaging of breast: Secondary | ICD-10-CM

## 2020-04-02 DIAGNOSIS — S83241D Other tear of medial meniscus, current injury, right knee, subsequent encounter: Secondary | ICD-10-CM | POA: Diagnosis not present

## 2020-04-02 DIAGNOSIS — S83281D Other tear of lateral meniscus, current injury, right knee, subsequent encounter: Secondary | ICD-10-CM | POA: Diagnosis not present

## 2020-04-06 ENCOUNTER — Ambulatory Visit
Admission: RE | Admit: 2020-04-06 | Discharge: 2020-04-06 | Disposition: A | Discharge status: Home / Self Care | Payer: BC Managed Care – PPO | Source: Ambulatory Visit | Attending: Family Medicine | Admitting: Family Medicine

## 2020-04-06 ENCOUNTER — Ambulatory Visit: Payer: BC Managed Care – PPO

## 2020-04-06 ENCOUNTER — Other Ambulatory Visit: Payer: Self-pay

## 2020-04-06 DIAGNOSIS — R928 Other abnormal and inconclusive findings on diagnostic imaging of breast: Secondary | ICD-10-CM | POA: Diagnosis not present

## 2020-04-06 DIAGNOSIS — N6489 Other specified disorders of breast: Secondary | ICD-10-CM | POA: Diagnosis not present

## 2020-04-08 DIAGNOSIS — M1711 Unilateral primary osteoarthritis, right knee: Secondary | ICD-10-CM | POA: Diagnosis not present

## 2020-04-15 DIAGNOSIS — M1711 Unilateral primary osteoarthritis, right knee: Secondary | ICD-10-CM | POA: Diagnosis not present

## 2020-04-24 DIAGNOSIS — M1711 Unilateral primary osteoarthritis, right knee: Secondary | ICD-10-CM | POA: Diagnosis not present

## 2020-05-04 DIAGNOSIS — J069 Acute upper respiratory infection, unspecified: Secondary | ICD-10-CM | POA: Diagnosis not present

## 2020-09-03 DIAGNOSIS — I10 Essential (primary) hypertension: Secondary | ICD-10-CM | POA: Diagnosis not present

## 2020-09-03 DIAGNOSIS — E7800 Pure hypercholesterolemia, unspecified: Secondary | ICD-10-CM | POA: Diagnosis not present

## 2020-09-03 DIAGNOSIS — E118 Type 2 diabetes mellitus with unspecified complications: Secondary | ICD-10-CM | POA: Diagnosis not present

## 2020-09-03 DIAGNOSIS — Z79899 Other long term (current) drug therapy: Secondary | ICD-10-CM | POA: Diagnosis not present

## 2021-04-24 DIAGNOSIS — R300 Dysuria: Secondary | ICD-10-CM | POA: Diagnosis not present

## 2021-04-24 DIAGNOSIS — I10 Essential (primary) hypertension: Secondary | ICD-10-CM | POA: Diagnosis not present

## 2021-04-24 DIAGNOSIS — N3000 Acute cystitis without hematuria: Secondary | ICD-10-CM | POA: Diagnosis not present

## 2021-04-30 DIAGNOSIS — E119 Type 2 diabetes mellitus without complications: Secondary | ICD-10-CM | POA: Diagnosis not present

## 2021-04-30 DIAGNOSIS — H52203 Unspecified astigmatism, bilateral: Secondary | ICD-10-CM | POA: Diagnosis not present

## 2021-05-01 ENCOUNTER — Other Ambulatory Visit: Payer: Self-pay | Admitting: Family Medicine

## 2021-05-01 DIAGNOSIS — Z1231 Encounter for screening mammogram for malignant neoplasm of breast: Secondary | ICD-10-CM

## 2021-05-08 DIAGNOSIS — Z9622 Myringotomy tube(s) status: Secondary | ICD-10-CM | POA: Diagnosis not present

## 2021-05-08 DIAGNOSIS — H9212 Otorrhea, left ear: Secondary | ICD-10-CM | POA: Diagnosis not present

## 2021-05-08 DIAGNOSIS — H6121 Impacted cerumen, right ear: Secondary | ICD-10-CM | POA: Diagnosis not present

## 2021-05-22 ENCOUNTER — Ambulatory Visit
Admission: RE | Admit: 2021-05-22 | Discharge: 2021-05-22 | Disposition: A | Discharge status: Home / Self Care | Payer: Medicare Other | Source: Ambulatory Visit | Attending: Family Medicine | Admitting: Family Medicine

## 2021-05-22 DIAGNOSIS — Z1231 Encounter for screening mammogram for malignant neoplasm of breast: Secondary | ICD-10-CM

## 2021-06-25 ENCOUNTER — Other Ambulatory Visit: Payer: Self-pay | Admitting: Family Medicine

## 2021-06-25 DIAGNOSIS — E2839 Other primary ovarian failure: Secondary | ICD-10-CM

## 2021-06-26 ENCOUNTER — Other Ambulatory Visit (HOSPITAL_COMMUNITY): Payer: Self-pay | Admitting: Family Medicine

## 2021-06-26 DIAGNOSIS — R011 Cardiac murmur, unspecified: Secondary | ICD-10-CM

## 2021-07-04 ENCOUNTER — Other Ambulatory Visit: Payer: Self-pay

## 2021-07-04 ENCOUNTER — Ambulatory Visit (HOSPITAL_COMMUNITY): Payer: Medicare Other | Attending: Cardiology

## 2021-07-04 DIAGNOSIS — R011 Cardiac murmur, unspecified: Secondary | ICD-10-CM | POA: Diagnosis not present

## 2021-07-04 LAB — ECHOCARDIOGRAM COMPLETE
AR max vel: 7.17 cm2
AV Area VTI: 7.81 cm2
AV Area mean vel: 7.4 cm2
AV Mean grad: 25 mmHg
AV Peak grad: 44.9 mmHg
Ao pk vel: 3.35 m/s
Area-P 1/2: 3.08 cm2
S' Lateral: 2.5 cm

## 2021-12-03 ENCOUNTER — Ambulatory Visit
Admission: RE | Admit: 2021-12-03 | Discharge: 2021-12-03 | Disposition: A | Discharge status: Home / Self Care | Payer: Medicare Other | Source: Ambulatory Visit | Attending: Family Medicine | Admitting: Family Medicine

## 2021-12-03 DIAGNOSIS — M8589 Other specified disorders of bone density and structure, multiple sites: Secondary | ICD-10-CM | POA: Diagnosis not present

## 2021-12-03 DIAGNOSIS — E2839 Other primary ovarian failure: Secondary | ICD-10-CM

## 2021-12-03 DIAGNOSIS — Z780 Asymptomatic menopausal state: Secondary | ICD-10-CM | POA: Diagnosis not present

## 2021-12-22 DIAGNOSIS — E1169 Type 2 diabetes mellitus with other specified complication: Secondary | ICD-10-CM | POA: Diagnosis not present

## 2021-12-22 DIAGNOSIS — I10 Essential (primary) hypertension: Secondary | ICD-10-CM | POA: Diagnosis not present

## 2021-12-22 DIAGNOSIS — E7800 Pure hypercholesterolemia, unspecified: Secondary | ICD-10-CM | POA: Diagnosis not present

## 2022-02-11 DIAGNOSIS — R194 Change in bowel habit: Secondary | ICD-10-CM | POA: Diagnosis not present

## 2022-02-12 ENCOUNTER — Telehealth: Payer: Self-pay | Admitting: *Deleted

## 2022-02-12 NOTE — Patient Outreach (Signed)
  Care Coordination   Initial Visit Note   02/12/2022 Name: SALINA STANFIELD MRN: [id number] DOB: [birth year]  Frederic Jericho Veloso is a 66 y.o. year old female who sees Koirala, Dibas, MD for primary care. I spoke with  Otis Dials by phone today.  What matters to the patients health and wellness today?  No needs    Goals Addressed               This Visit's Progress     COMPLETED: "we are alright for now" (pt-stated)        Care Coordination Interventions: Advised patient to contact her provider's office to scheduled her AWV  Reviewed medications with patient and discussed adherence wit no needs Assessed social determinant of health barriers          SDOH assessments and interventions completed:  Yes  SDOH Interventions Today    Flowsheet Row Most Recent Value  SDOH Interventions   Food Insecurity Interventions Intervention Not Indicated  Housing Interventions Intervention Not Indicated  Transportation Interventions Intervention Not Indicated        Care Coordination Interventions Activated:  Yes  Care Coordination Interventions:  Yes, provided   Follow up plan: No further intervention required.   Encounter Outcome:  Pt. Visit Completed   Raina Mina, RN Care Management Coordinator [place] Office [phone]

## 2022-02-12 NOTE — Patient Instructions (Signed)
Visit Information  Thank you for taking time to visit with me today. Please don't hesitate to contact me if I can be of assistance to you.   Following are the goals we discussed today:   Goals Addressed               This Visit's Progress     COMPLETED: "we are alright for now" (pt-stated)        Care Coordination Interventions: Advised patient to contact her provider's office to scheduled her AWV  Reviewed medications with patient and discussed adherence wit no needs Assessed social determinant of health barriers          Please call the care guide team at [phone] if you need to cancel or reschedule your appointment.   If you are experiencing a Mental Health or [place] or need someone to talk to, please call the Suicide and Crisis Lifeline: 988 call the [place] National Suicide Prevention Lifeline: (802)012-6331 or TTY: 939-151-8055 TTY (303)184-7618) to talk to a trained counselor call 1-800-273-TALK (toll free, 24 hour hotline)  Patient verbalizes understanding of instructions and care plan provided today and agrees to view in [place]. Active MyChart status and patient understanding of how to access instructions and care plan via MyChart confirmed with patient.     No further follow up required: No needs presented today  Raina Mina, RN Care Management Coordinator [place] Office [phone]

## 2022-03-03 ENCOUNTER — Telehealth: Payer: Self-pay | Admitting: *Deleted

## 2022-03-03 NOTE — Telephone Encounter (Signed)
Spoke with patient about research study for V1P  Will need records from PCP with labs and office note.  Patient is going to let me know and we get her set up if she meets criteria.

## 2022-03-25 ENCOUNTER — Encounter: Payer: Medicare Other | Admitting: *Deleted

## 2022-03-25 VITALS — BP 137/62 | HR 60 | Temp 98.3°F | Resp 14 | Ht 59.0 in | Wt 169.0 lb

## 2022-03-25 DIAGNOSIS — Z006 Encounter for examination for normal comparison and control in clinical research program: Secondary | ICD-10-CM

## 2022-03-25 NOTE — Research (Signed)
[place] -1 Prevent Informed Consent   Subject Name: [name]  Subject met inclusion and exclusion criteria.  The informed consent form, study requirements and expectations were reviewed with the subject and questions and concerns were addressed prior to the signing of the consent form.  The subject verbalized understanding of the trial requirements.  The subject agreed to participate in the [place] -1 prevent trial and signed the informed consent on 03/25/2022.  The informed consent was obtained prior to performance of any protocol-specific procedures for the subject.  A copy of the signed informed consent was given to the subject and a copy was placed in the subject's medical record.   Philemon Kingdom D    VP1 Screening Visit  Inclusion and exclusion criteria reviewed _0  Patient demography reviewed _1  Smoking history reviewed _2   Never _3  Former _4  Current Every Day _5  Current Some Days _6  Cigarettes _7   Pipe _8   Cigars _9   e-Cigarettes _10   Packs Per Day: 1  Years: 59 Quit date: 1984  Past and current medical history reviewed _11  Surgical history reviewed _12  Prior or concomitant medications reviewed _13  Lipid lowering medications reviewed _14   Physical Exam performed by PI or SUB-I _15   STUCKEY  Vital signs: Waist circumference: 101.6 cm  Vitals:   03/25/22 1033 03/25/22 1038 03/25/22 1049  BP: (!) 144/70 138/62 137/62  Pulse: 66 64 60  Temp: 98.3 F (36.8 C)    Resp: _16 Height: _17  (1.499 m)    Weight: 169 lb (76.7 kg)    SpO2: 100% 98% 100%  TempSrc: Temporal    BMI (Calculated): 34.12      Mean BP: 140/65   Mean Pulse: 63  Labs collected at: 10:52    include: Serum pregnancy _18  N/A  Fasting Lipid Profile _19  Broad Clinical Chemistry _20  Hematology panel _21  Urinalysis _22   Genetic Consent obtained _23   AE/SAE assessment completed _24    Inclusion  YES NO   1-Written informed consent must be obtained before any  assessment is performed   _25  _26    2-Female or female ?67 but <32 years of age   _27  _28    3-At an increased risk for a first MACE (i.e., no prior major ASCVD event), defined as any one of the following       a. Evidence of atherosclerotic coronary artery disease (CAD) on computer tomography (CT) or invasive coronary angiogram defined as a coronary artery stenosis ?20% but <50% in the left main coronary artery or stenosis ?20% but <70% in any other major epicardial coronary artery, or   _29     b. Coronary artery calcium (CAC) score obtained by CT-scan ?100 Agatston units, or   _30  _____    c. High 10-year ASCVD risk ?20%, or   _31     d. Intermediate 10-year ASCVD risk 7.5% - <20% with at least 2 risk-enhancing factors   _32     If on a background LLT, the dose should be stable for at least 4 weeks prior to the screening visit and the participant should be willing to remain on this background therapy for the entire duration of the study.   _33  _34    LDL-C ?70 mg/dL (?1.81 mmol/L) but <190 mg/dL (<4.91 mmol/L) at the screening visit.   _35  _36     Exclusion YES  NO   History of major ASCVD event defined as any one of the following   _37  _38    a. Acute coronary syndrome (ACS) in the 12 months  prior to randomization, or   _0  _1    b. Prior myocardial infarction at any time prior to randomization   _2  _3    c. Prior ischemic stroke at any time prior to randomization   _4  _5    d. Symptomatic peripheral artery disease (PAD) as evidenced by either intermittent claudication, previous revascularization, or amputation due to atherosclerotic disease at any time prior to randomization.   _6  _7    History of, or planned, ischemia-driven revascularization in a coronary or extracoronary arterial bed prior to randomization   _8  _9    Absence of coronary atherosclerosis on a CT angiogram or an invasive coronary angiogram in the 2 years prior to randomization   _10  _11    Coronary artery calcium  (CAC) score of 0 obtained in the 2 years prior to randomization   _12  _13    Active liver disease or hepatic dysfunction   _14  _15    Previous, current, or planned treatment with a monoclonal antibody (mAb) directed toward proprotein convertase subtilisin/kexin type 9 (PCSK9) (e.g., evolocumab, alirocumab)   _16  _17    Pregnant or nursing (lactating) women   _18  _19    Women of childbearing potential unless they are using effective methods of contraception while taking study treatment which includes for 6 months after last study drug administration   _20  _21       Current Outpatient Medications:    aspirin EC 81 MG tablet, Take 81 mg by mouth daily., Disp: , Rfl:    cholecalciferol (VITAMIN D3) 25 MCG (1000 UNIT) tablet, Take 2,000 Units by mouth daily., Disp: , Rfl:    glimepiride (AMARYL) 1 MG tablet, Take 0.5 mg by mouth every morning., Disp: , Rfl:    irbesartan (AVAPRO) 150 MG tablet, Take 150 mg by mouth daily., Disp: , Rfl:    metFORMIN (GLUCOPHAGE) 1000 MG tablet, Take 1,000 mg by mouth 2 (two) times daily with a meal., Disp: , Rfl:    psyllium (METAMUCIL) 58.6 % powder, Take 1 packet by mouth daily., Disp: , Rfl:    rosuvastatin (CRESTOR) 20 MG tablet, Take 20 mg by mouth at bedtime., Disp: , Rfl:    ciprofloxacin (CILOXAN) 0.3 % ophthalmic solution, Apply 3 drops in left ear twice daily for 7 days. (Patient not taking: Reported on 03/25/2022), Disp: , Rfl:    dexamethasone (DECADRON) 0.1 % ophthalmic solution, Apply 2 drops in left ear twice daily for 7 days. (Patient not taking: Reported on 03/25/2022), Disp: , Rfl:

## 2022-03-30 NOTE — Progress Notes (Signed)
Patient seen as a screening patient today as screening for V1-Prevention.  She is a 66 year old who is asymptomatic, and has multiple cardiovascular risk factors, including hypertension, Type II DM, and hyperlipidemia.  Her ASCVD risk score is qualifying (15%) and she carries at least 2 risk enhancing risk scores.  She exerts without difficulty.  Notably she has moderate AS with peak velocity of approximately 3.4 m/s.    I have reviewed the purpose of the study and the protocol in detail, including randomization and the possibility of placebo administration.  Inclisiran has been reviewed with the patient in detail.   Alert, oriented female in NAD BP 137/62 P 60 T 98.3 R 14 Lungs clear.  No JVD Bilateral carotid bruits (transmitted versus local) radiating from RUSE.  PMI non displaced.  Normal S2.  3-6 SEM with mid to late peak.  No diastolic murmur Abdomen soft without masses  No extremity edema Gross neuro intact.    Impression   Stable, asymptomatic patient with elevated ASCVD risk with enhancing factors   Plan    Screening labs for VP-1   Consider structural evaluation for AS   Arturo Morton. Riley Kill, MD, [facility] Medical Director, [facility]

## 2022-03-30 NOTE — Research (Addendum)
Please review for clinically significant or not clinically significant.. Also please review for continuing of study:   LDL is remaining elevated and is clinically significant.  Other labs are not clinically significant.  She still could qualify, but needs to be maximized on medical therapy.  Will coordinate with research coordinator for follow up.  Arturo Morton. Riley Kill, MD, [facility]

## 2022-04-08 NOTE — Progress Notes (Signed)
Cardiology Office Note:   Date:  04/15/2022  NAME:  [name]    MRN: [id number] DOB:  21-Jan-1956   PCP:  Darrow Bussing, MD  Cardiologist:  None  Electrophysiologist:  None   Referring MD: Darrow Bussing, MD   Chief Complaint  Patient presents with   Aortic Stenosis   History of Present Illness:   [name] is a 66 y.o. female with a hx of DM, HTN, HLD, AS who is being seen today for the evaluation of aortic stenosis at the request of Koirala, Dibas, MD. recently evaluated for research study for hyperlipidemia.  Echocardiogram shows moderate aortic stenosis.  She reports has been told she had a murmur.  She is diabetic.  She has high blood pressure.  She has never had a heart attack or stroke.  She is a never smoker.  She is a retired Engineer, civil (consulting).  She presents with her husband who also is establishing care.  She reports no chest pain or trouble breathing.  She is diabetic but this is controlled.  Her lipids could be better.  We discussed increasing her Crestor.  We discussed calcium scoring.  Her husband was found to have coronary artery disease by calcium scoring.  She has normal kidney function.  She reports she walks 5000 steps per day.  No issues reported.  They are married.  They have 2 children.  Denies any symptoms in office.  Problem List Aortic stenosis -Moderate 07/04/2021 2. DM -A1c 6.3 3. HLD -T chol 204, HDL 53, LDL 133, TG 102 4. HTN   Past Medical History: Past Medical History:  Diagnosis Date   Diabetes mellitus without complication (HCC) 2010   Diverticulitis 06/2015   High cholesterol 1987   Hypertension 2017   Obesity    Ureteral stone 06/2015    Past Surgical History: Past Surgical History:  Procedure Laterality Date   BREAST BIOPSY Left 11/10/2005   benign   CESAREAN SECTION  1978   1982 second   LAPAROSCOPIC HYSTERECTOMY Bilateral 2008   TONSILECTOMY, ADENOIDECTOMY, BILATERAL MYRINGOTOMY AND TUBES  1976    Current  Medications: Current Meds  Medication Sig   aspirin EC 81 MG tablet Take 81 mg by mouth daily.   cholecalciferol (VITAMIN D3) 25 MCG (1000 UNIT) tablet Take 2,000 Units by mouth daily.   glimepiride (AMARYL) 1 MG tablet Take 0.5 mg by mouth every morning.   irbesartan (AVAPRO) 150 MG tablet Take 150 mg by mouth daily.   metFORMIN (GLUCOPHAGE) 1000 MG tablet Take 1,000 mg by mouth 2 (two) times daily with a meal.   psyllium (METAMUCIL) 58.6 % powder Take 1 packet by mouth daily.   [DISCONTINUED] rosuvastatin (CRESTOR) 20 MG tablet Take 20 mg by mouth at bedtime.     Allergies:    Penicillins   Social History: Social History   Socioeconomic History   Marital status: Married    Spouse name: Not on file   Number of children: 2   Years of education: Not on file   Highest education level: Not on file  Occupational History   Occupation: Retired Engineer, civil (consulting)  Tobacco Use   Smoking status: Former   Smokeless tobacco: Never  Building services engineer Use: Never used  Substance and Sexual Activity   Alcohol use: Yes    Alcohol/week: 2.0 standard drinks of alcohol    Types: 2 Glasses of wine per week    Comment: on the weekends   Drug use: No  Sexual activity: Not on file  Other Topics Concern   Not on file  Social History Narrative   Not on file   Social Determinants of Health   Financial Resource Strain: Not on file  Food Insecurity: No Food Insecurity (02/12/2022)   Hunger Vital Sign    Worried About Running Out of Food in the Last Year: Never true    Ran Out of Food in the Last Year: Never true  Transportation Needs: No Transportation Needs (02/12/2022)   PRAPARE - Hydrologist (Medical): No    Lack of Transportation (Non-Medical): No  Physical Activity: Not on file  Stress: Not on file  Social Connections: Not on file     Family History: The patient's family history includes Cancer in her father and mother; Coronary artery disease in  her mother; Diabetes in her mother and sister; High Cholesterol in her mother; Hyperlipidemia in her mother; Hypertension in her sister; Kidney disease in her sister.  ROS:   All other ROS reviewed and negative. Pertinent positives noted in the HPI.     EKGs/Labs/Other Studies Reviewed:   The following studies were personally reviewed by me today:  EKG:  EKG is ordered today.  The ekg ordered today demonstrates NSR 75, normal, no acute changes, and was personally reviewed by me.   Recent Labs: No results found for requested labs within last 365 days.   Recent Lipid Panel No results found for: "CHOL", "TRIG", "HDL", "CHOLHDL", "VLDL", "LDLCALC", "LDLDIRECT"  Physical Exam:   VS:  BP (!) 148/82   Pulse 75   Ht 5' (1.524 m)   Wt 170 lb 9.6 oz (77.4 kg)   SpO2 98%   BMI 33.32 kg/m    Wt Readings from Last 3 Encounters:  04/15/22 170 lb 9.6 oz (77.4 kg)  03/25/22 169 lb (76.7 kg)  06/30/15 185 lb 6.5 oz (84.1 kg)    General: Well nourished, well developed, in no acute distress Head: Atraumatic, normal size  Eyes: PEERLA, EOMI  Neck: Supple, no JVD Endocrine: No thryomegaly Cardiac: Normal S1, S2; RRR; 3 out of 6 systolic ejection murmur, S2 is clearly heard Lungs: Clear to auscultation bilaterally, no wheezing, rhonchi or rales  Abd: Soft, nontender, no hepatomegaly  Ext: No edema, pulses 2+ Musculoskeletal: No deformities, BUE and BLE strength normal and equal Skin: Warm and dry, no rashes   Neuro: Alert and oriented to person, place, time, and situation, CNII-XII grossly intact, no focal deficits  Psych: Normal mood and affect   ASSESSMENT:   [name] is a 66 y.o. female who presents for the following: 1. Nonrheumatic aortic valve stenosis   2. Mixed hyperlipidemia   3. Primary hypertension     PLAN:   1. Nonrheumatic aortic valve stenosis -Moderate aortic stenosis.  Recheck echo in 1 year.  2. Mixed hyperlipidemia -She is diabetic.  Will check calcium  score.  Increase Crestor to 40 mg daily.  3. Primary hypertension -Blood pressure well-controlled today.  No change in medications.  Disposition: Return in about 1 year (around 04/16/2023).  Medication Adjustments/Labs and Tests Ordered: Current medicines are reviewed at length with the patient today.  Concerns regarding medicines are outlined above.  Orders Placed This Encounter  Procedures   CT CARDIAC SCORING (SELF PAY ONLY)   EKG 12-Lead   ECHOCARDIOGRAM COMPLETE   Meds ordered this encounter  Medications   rosuvastatin (CRESTOR) 40 MG tablet    Sig: Take 1 tablet (40 mg  total) by mouth at bedtime.    Dispense:  90 tablet    Refill:  3    Patient Instructions  Medication Instructions:  Increase Crestor 40 mg daily   *If you need a refill on your cardiac medications before your next appointment, please call your pharmacy*   Testing/Procedures: CALCIUM SCORE   Echocardiogram (12 months) - Your physician has requested that you have an echocardiogram. Echocardiography is a painless test that uses sound waves to create images of your heart. It provides your doctor with information about the size and shape of your heart and how well your heart's chambers and valves are working. This procedure takes approximately one hour. There are no restrictions for this procedure.     Follow-Up: At [facility], you and your health needs are our priority.  As part of our continuing mission to provide you with exceptional heart care, we have created designated Provider Care Teams.  These Care Teams include your primary Cardiologist (physician) and Advanced Practice Providers (APPs -  Physician Assistants and Nurse Practitioners) who all work together to provide you with the care you need, when you need it.  We recommend signing up for the patient portal called "MyChart".  Sign up information is provided on this After Visit Summary.  MyChart is used to connect with patients for Virtual  Visits (Telemedicine).  Patients are able to view lab/test results, encounter notes, upcoming appointments, etc.  Non-urgent messages can be sent to your provider as well.   To learn more about what you can do with MyChart, go to [email or web address].    Your next appointment:   12 month(s)  The format for your next appointment:   In Person  Provider:   Eleonore Chiquito, MD         Signed, Addison Naegeli. Audie Box, MD, [place]  [address], [place], [place] [address] [phone]  04/15/2022 2:35 PM

## 2022-04-15 ENCOUNTER — Ambulatory Visit: Payer: Medicare Other | Attending: Cardiovascular Disease | Admitting: Cardiovascular Disease

## 2022-04-15 ENCOUNTER — Encounter: Payer: Self-pay | Admitting: Cardiovascular Disease

## 2022-04-15 VITALS — BP 148/82 | HR 75 | Ht 60.0 in | Wt 170.6 lb

## 2022-04-15 DIAGNOSIS — I10 Essential (primary) hypertension: Secondary | ICD-10-CM | POA: Diagnosis not present

## 2022-04-15 DIAGNOSIS — I350 Nonrheumatic aortic (valve) stenosis: Secondary | ICD-10-CM | POA: Diagnosis not present

## 2022-04-15 DIAGNOSIS — E782 Mixed hyperlipidemia: Secondary | ICD-10-CM

## 2022-04-15 MED ORDER — ROSUVASTATIN CALCIUM 40 MG PO TABS: 40.0000 mg | ORAL_TABLET | Freq: Every day | ORAL | 3 refills | Status: DC

## 2022-04-15 NOTE — Patient Instructions (Signed)
Medication Instructions:  Increase Crestor 40 mg daily   *If you need a refill on your cardiac medications before your next appointment, please call your pharmacy*   Testing/Procedures: CALCIUM SCORE   Echocardiogram (12 months) - Your physician has requested that you have an echocardiogram. Echocardiography is a painless test that uses sound waves to create images of your heart. It provides your doctor with information about the size and shape of your heart and how well your heart's chambers and valves are working. This procedure takes approximately one hour. There are no restrictions for this procedure.     Follow-Up: At [facility], you and your health needs are our priority.  As part of our continuing mission to provide you with exceptional heart care, we have created designated Provider Care Teams.  These Care Teams include your primary Cardiologist (physician) and Advanced Practice Providers (APPs -  Physician Assistants and Nurse Practitioners) who all work together to provide you with the care you need, when you need it.  We recommend signing up for the patient portal called "MyChart".  Sign up information is provided on this After Visit Summary.  MyChart is used to connect with patients for Virtual Visits (Telemedicine).  Patients are able to view lab/test results, encounter notes, upcoming appointments, etc.  Non-urgent messages can be sent to your provider as well.   To learn more about what you can do with MyChart, go to [email or web address].    Your next appointment:   12 month(s)  The format for your next appointment:   In Person  Provider:   Lennie Odor, MD

## 2022-04-28 ENCOUNTER — Ambulatory Visit (HOSPITAL_BASED_OUTPATIENT_CLINIC_OR_DEPARTMENT_OTHER)
Admission: RE | Admit: 2022-04-28 | Discharge: 2022-04-28 | Disposition: A | Discharge status: Home / Self Care | Payer: Medicare Other | Source: Ambulatory Visit | Attending: Cardiovascular Disease | Admitting: Cardiovascular Disease

## 2022-04-28 DIAGNOSIS — E782 Mixed hyperlipidemia: Secondary | ICD-10-CM

## 2022-05-05 DIAGNOSIS — E119 Type 2 diabetes mellitus without complications: Secondary | ICD-10-CM | POA: Diagnosis not present

## 2022-05-05 DIAGNOSIS — H2513 Age-related nuclear cataract, bilateral: Secondary | ICD-10-CM | POA: Diagnosis not present

## 2022-05-05 DIAGNOSIS — H52203 Unspecified astigmatism, bilateral: Secondary | ICD-10-CM | POA: Diagnosis not present

## 2022-05-28 DIAGNOSIS — H6993 Unspecified Eustachian tube disorder, bilateral: Secondary | ICD-10-CM | POA: Diagnosis not present

## 2022-05-28 DIAGNOSIS — H6121 Impacted cerumen, right ear: Secondary | ICD-10-CM | POA: Diagnosis not present

## 2022-05-28 DIAGNOSIS — Z9622 Myringotomy tube(s) status: Secondary | ICD-10-CM | POA: Diagnosis not present

## 2022-05-28 DIAGNOSIS — H7291 Unspecified perforation of tympanic membrane, right ear: Secondary | ICD-10-CM | POA: Diagnosis not present

## 2022-06-10 ENCOUNTER — Other Ambulatory Visit: Payer: Self-pay | Admitting: Family Medicine

## 2022-06-10 DIAGNOSIS — Z1231 Encounter for screening mammogram for malignant neoplasm of breast: Secondary | ICD-10-CM

## 2022-06-15 ENCOUNTER — Encounter: Payer: Medicare Other | Admitting: *Deleted

## 2022-06-15 VITALS — BP 130/65 | HR 67 | Resp 16 | Ht 59.0 in | Wt 171.0 lb

## 2022-06-15 DIAGNOSIS — Z006 Encounter for examination for normal comparison and control in clinical research program: Secondary | ICD-10-CM

## 2022-06-15 NOTE — Research (Cosign Needed Addendum)
VP1 Screening Visit  Subject number: [id number] Informed consent signed '[x]'$   ICF signed on 06/15/2022  Inclusion and exclusion criteria reviewed '[x]'$  Patient demography reviewed '[x]'$   Smoking history reviewed '[x]'$   Never '[]'$  Former '[x]'$  Current Every Day '[]'$  Current Some Days '[]'$  Cigarettes '[x]'$   Pipe '[]'$   Cigars '[]'$   e-Cigarettes '[]'$   Packs Per Day: 1   Quit [id number]  Past and current medical history reviewed '[x]'$  Surgical history reviewed '[x]'$  Prior or concomitant medications reviewed '[x]'$  Lipid lowering medications reviewed '[x]'$   Physical Exam performed by PI or SUB-I '[]'$  DUE TO EMERGENCY NP WASN'T ABLE TO SEE PATIENT WILL SEE HER AT NEXT VISIT   Waist circumference: 40 in / 101.6 cm   Vitals:   06/15/22 0931 06/15/22 0933 06/15/22 0936  BP: 127/68 122/67 130/65  Pulse: 70 65 67  Resp: 16    Height: '4\' 11"'$  (1.499 m)    Weight: 171 lb (77.6 kg)    SpO2: 99%    BMI (Calculated): 34.52      Mean BP: 126/66    Mean Pulse: 67  Labs collected at: 0930 Serum pregnancy '[x]'$ N/a Fasting Lipid Profile '[x]'$  Broad Clinical Chemistry '[x]'$  Hematology panel '[x]'$  Urinalysis '[x]'$   Genetic Consent obtained '[x]'$   AE/SAE assessment completed '[x]'$ 

## 2022-06-16 NOTE — Research (Addendum)
V1P Informed Consent  Protocol No. [id number] ICF Version number:04-27-01     Subject met inclusion and exclusion criteria.  The informed consent form, study requirements and expectations were reviewed with the subject and questions and concerns were addressed prior to the signing of the consent form.  The subject verbalized understanding of the trial requirements.  The subject agreed to participate in the Victorion 1 Prevent trial and signed the informed consent on 19- Feb-2023 at 0920  Informed consent was obtained prior to performance of any protocol-specific procedures. A copy of the signed informed consent was given to the subject and original copy was placed in the subject's medical record.   [name] was place in a quiet room, allowed time to read over the consent, and ask questions. [name] Signed consent for Victorion 1-Prevent at [phone]. Copy given to her to take home. Copy placed in binder.    [name] was brought back in for re screen. The PI wanted her to see her Cardiologist and have statin dose increased then re screen after 30 days.

## 2022-06-22 NOTE — Research (Signed)
Are there any labs that are clinically significant?  Yes '[]'$  OR No'[x]'$   Is the patient eligible to continue enrollment in the study after screening visit?  Yes '[x]'$   OR No'[]'$ 

## 2022-06-29 DIAGNOSIS — E1169 Type 2 diabetes mellitus with other specified complication: Secondary | ICD-10-CM | POA: Diagnosis not present

## 2022-06-29 DIAGNOSIS — Z0001 Encounter for general adult medical examination with abnormal findings: Secondary | ICD-10-CM | POA: Diagnosis not present

## 2022-06-29 DIAGNOSIS — D649 Anemia, unspecified: Secondary | ICD-10-CM | POA: Diagnosis not present

## 2022-06-29 DIAGNOSIS — I2510 Atherosclerotic heart disease of native coronary artery without angina pectoris: Secondary | ICD-10-CM | POA: Diagnosis not present

## 2022-06-29 DIAGNOSIS — I10 Essential (primary) hypertension: Secondary | ICD-10-CM | POA: Diagnosis not present

## 2022-06-29 DIAGNOSIS — E7800 Pure hypercholesterolemia, unspecified: Secondary | ICD-10-CM | POA: Diagnosis not present

## 2022-06-29 DIAGNOSIS — E119 Type 2 diabetes mellitus without complications: Secondary | ICD-10-CM | POA: Diagnosis not present

## 2022-06-29 DIAGNOSIS — Z79899 Other long term (current) drug therapy: Secondary | ICD-10-CM | POA: Diagnosis not present

## 2022-07-01 ENCOUNTER — Encounter: Payer: Self-pay | Admitting: *Deleted

## 2022-07-01 DIAGNOSIS — Z006 Encounter for examination for normal comparison and control in clinical research program: Secondary | ICD-10-CM

## 2022-07-01 NOTE — Research (Signed)
Email sent to remind [name] of her appointment tomorrow at 0800, gave parking code, and reminded to be NPO.

## 2022-07-02 ENCOUNTER — Encounter: Payer: Medicare Other | Admitting: *Deleted

## 2022-07-02 ENCOUNTER — Other Ambulatory Visit: Payer: Self-pay

## 2022-07-02 VITALS — BP 127/60 | HR 63

## 2022-07-02 DIAGNOSIS — Z006 Encounter for examination for normal comparison and control in clinical research program: Secondary | ICD-10-CM

## 2022-07-02 MED ORDER — STUDY - VICTORION-1 PREVENT - INCLISIRAN 300 MG/1.5 ML OR PLACEBO SQ INJECTION (PI-STUCKEY)
300.0000 mg | INJECTION | SUBCUTANEOUS | Status: DC
  Administered 2022-07-02: 300 mg via SUBCUTANEOUS
  Filled 2022-07-02: qty 1.5

## 2022-07-02 NOTE — Progress Notes (Signed)
Patient seen as a part of V1P research study. Overall, doing well. No complaints, or questions.   General: Well developed, well nourished, female appearing in no acute distress. Head: Normocephalic, atraumatic.  Neck: Supple without bruits, JVD. Lungs:  Resp regular and unlabored, CTA. Heart: RRR, S1, S2, no S3, S4, 2/6 systolic murmur; no rub. Abdomen: Soft, non-tender, non-distended with normoactive bowel sounds. No hepatomegaly. No rebound/guarding. No obvious abdominal masses. Extremities: No clubbing, cyanosis, edema.  Neuro: Alert and oriented X 3. Moves all extremities spontaneously. Psych: Normal affect.  Signed, Reino Bellis, NP-C 07/02/2022, 9:30 AM

## 2022-07-02 NOTE — Research (Addendum)
V1P Informed Re- Consent  Protocol No. [id number] ICF Version number: 01.01.03     Subject met inclusion and exclusion criteria.  The informed consent form, study requirements and expectations were reviewed with the subject and questions and concerns were addressed prior to the signing of the consent form.  The subject verbalized understanding of the trial requirements.  The subject agreed to participate in the Victorion 1 Prevent trial and signed the informed consent on 02-Jul-2022 at [place] consent was obtained prior to performance of any protocol-specific procedures. A copy of the signed informed consent was given to the subject and original copy was placed in the subject's medical record.   DATE: 02-July-2022  SUBJECT ID: [id number]   Visit: baseline Visit (V1)        Prior or concomitant medications reviewed '[x]'$   Lipid lowering medications reviewed '[x]'$   Surgical history reviewed '[x]'$   TIME: BP: PULSE:  0804 115/63 69  0806 142/66 65  0810 127/60 63  Mean BP: 128/63  Mean Pulse: 65.6  Labs collected at: 0810 Fasting Lipid Profile '[x]'$  Fasting Lp(a) '[x]'$  Liver function tests '[x]'$  Exploratory biomarkers '[]'$   AE/SAE assessment completed '[x]'$   Study drug administered '[x]'$   Endpoint assessment completed '[x]'$     Injection given in right lower abd at 0830 tol well. Kit number [id number]  Scheduled next appointment for Oct 20, 2022. Mr and Mrs Rusch will be leaving in April for vacation, and not coming back until [facility] June. Reino Bellis, NP did exam today.

## 2022-07-07 NOTE — Research (Cosign Needed)
Are there any labs that are clinically significant?  Yes '[]'$  OR No'[x]'$ 

## 2022-07-28 ENCOUNTER — Ambulatory Visit
Admission: RE | Admit: 2022-07-28 | Discharge: 2022-07-28 | Disposition: A | Discharge status: Home / Self Care | Payer: Medicare Other | Source: Ambulatory Visit | Attending: Family Medicine | Admitting: Family Medicine

## 2022-07-28 DIAGNOSIS — Z1231 Encounter for screening mammogram for malignant neoplasm of breast: Secondary | ICD-10-CM | POA: Diagnosis not present

## 2022-08-14 IMAGING — MG MM DIGITAL SCREENING BILAT W/ TOMO AND CAD
8 series · 8 of 24 positions shown · non-contrast
Comparison: Previous exam(s).

CLINICAL DATA: Screening.

EXAM:
DIGITAL SCREENING BILATERAL MAMMOGRAM WITH TOMOSYNTHESIS AND CAD
TECHNIQUE: Bilateral screening digital craniocaudal and mediolateral oblique
mammograms were obtained. Bilateral screening digital breast
tomosynthesis was performed. The images were evaluated with
computer-aided detection.

[L MLO synth-2D]
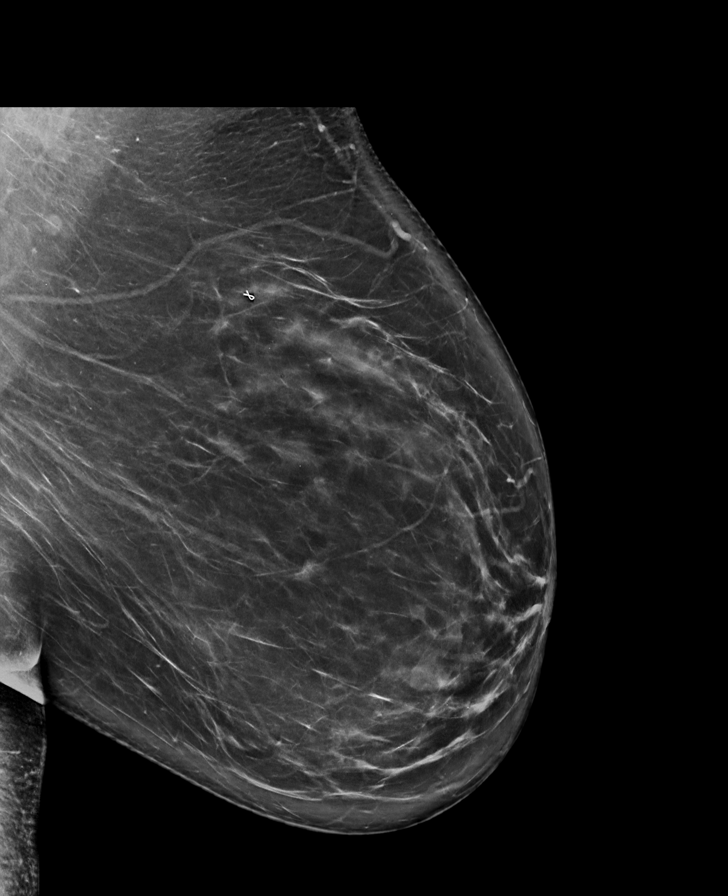

[L CC synth-2D]
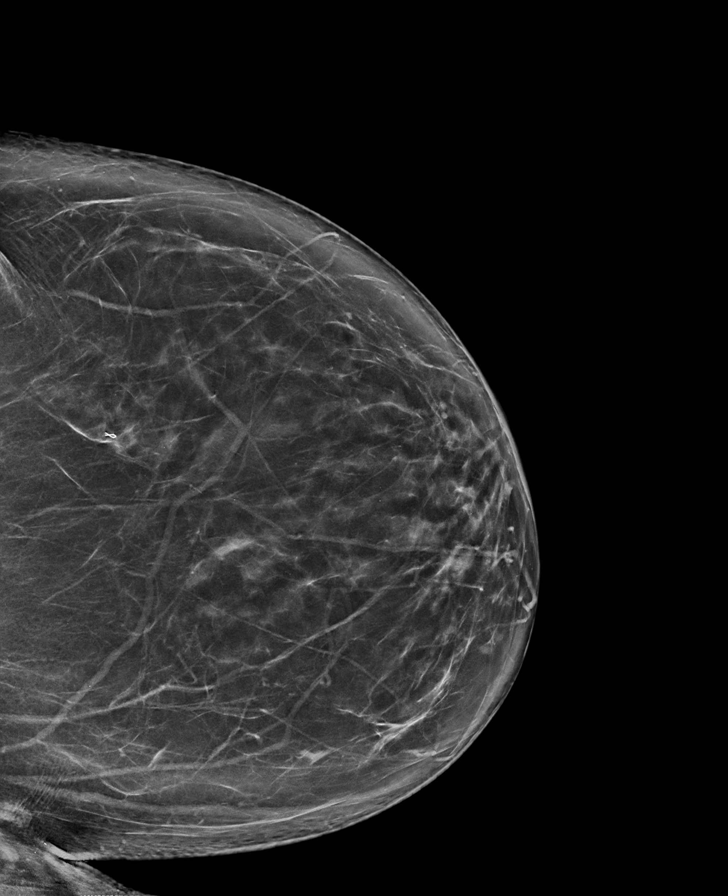

[R MLO synth-2D]
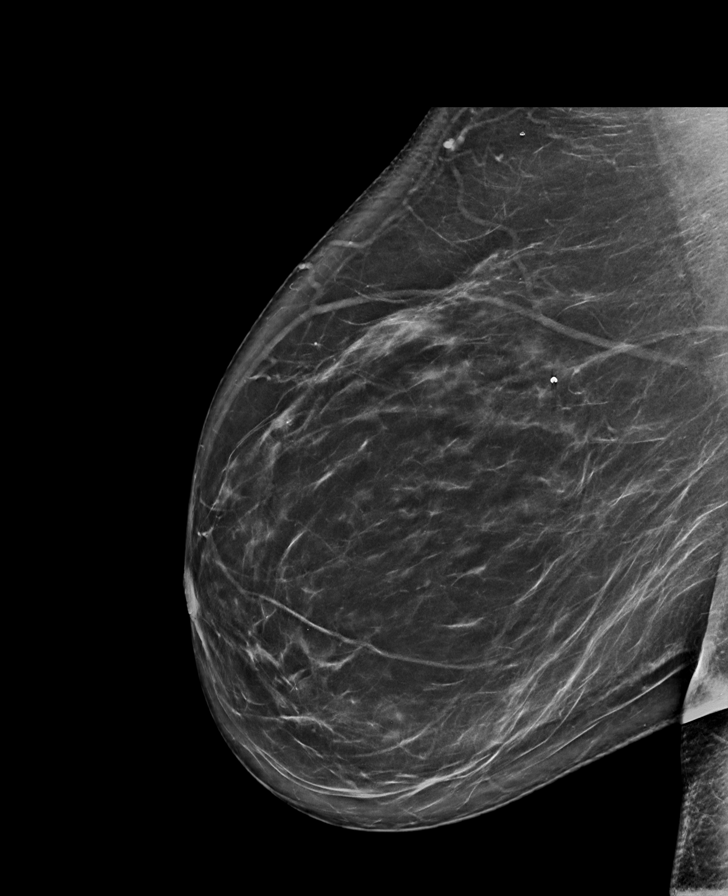

[R CC synth-2D]
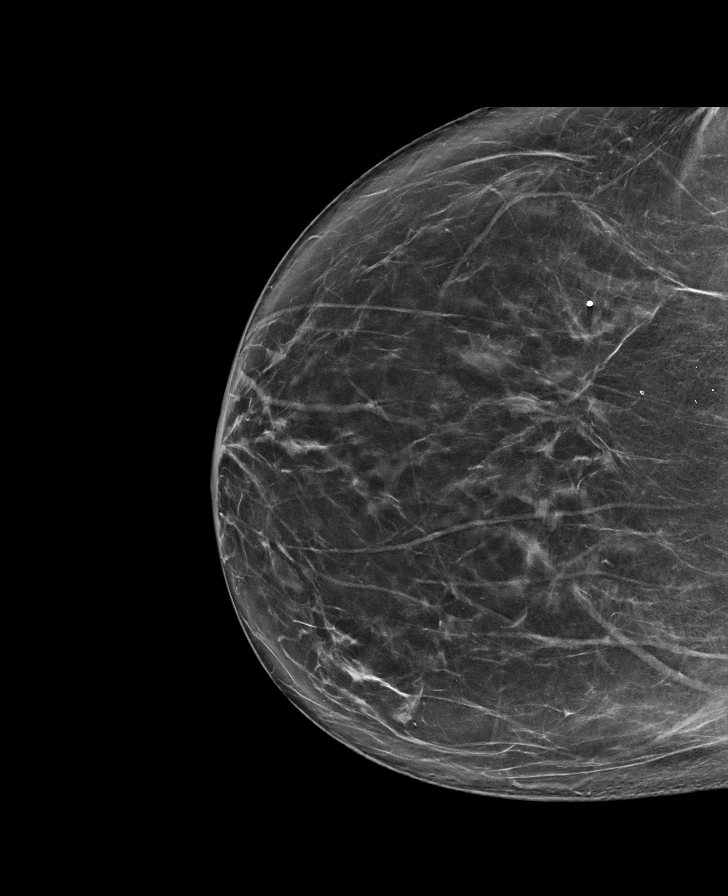

[R CC tomo · tomo slice 43/84.0]
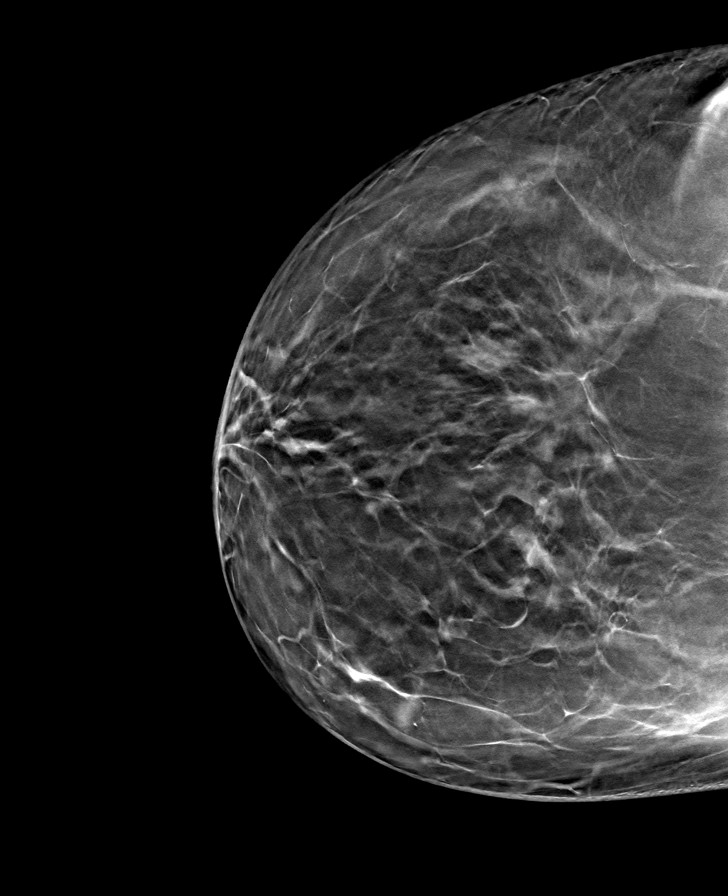

[L CC tomo · tomo slice 45/88.0]
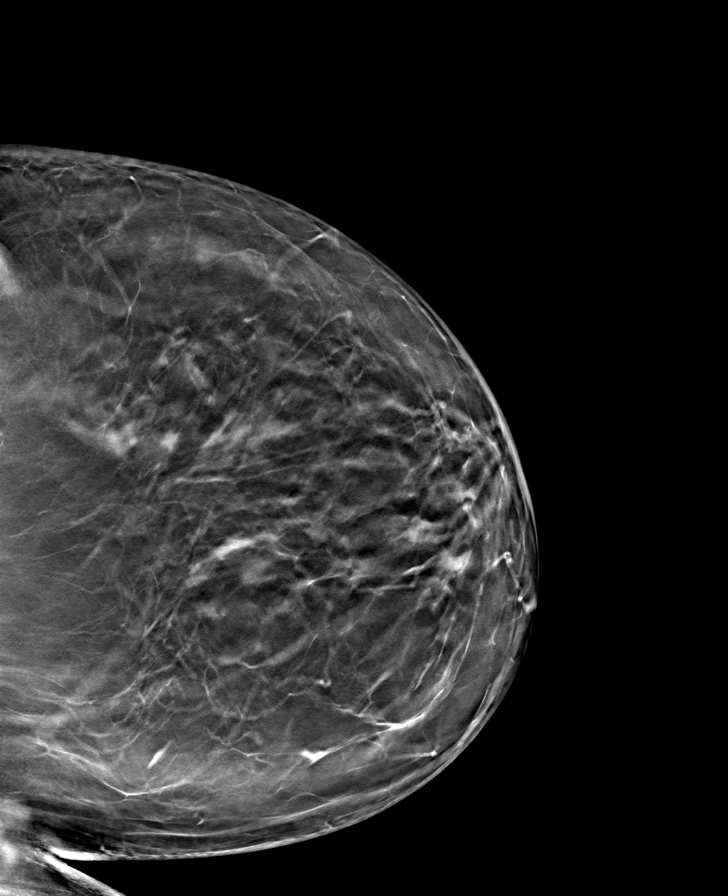

[L MLO tomo · tomo slice 49/98.0]
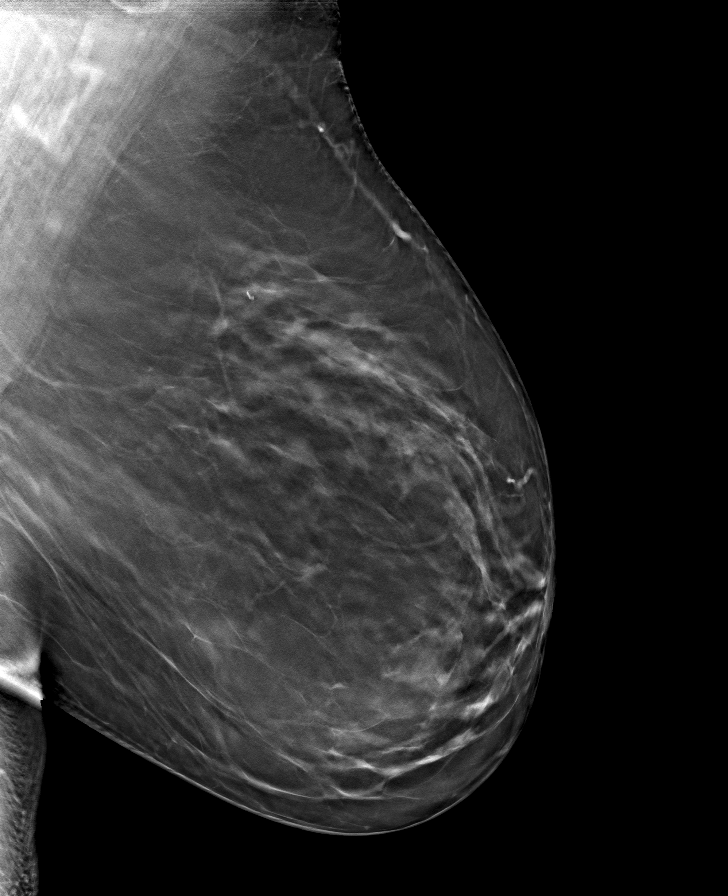

[R MLO tomo · tomo slice 46/91.0]
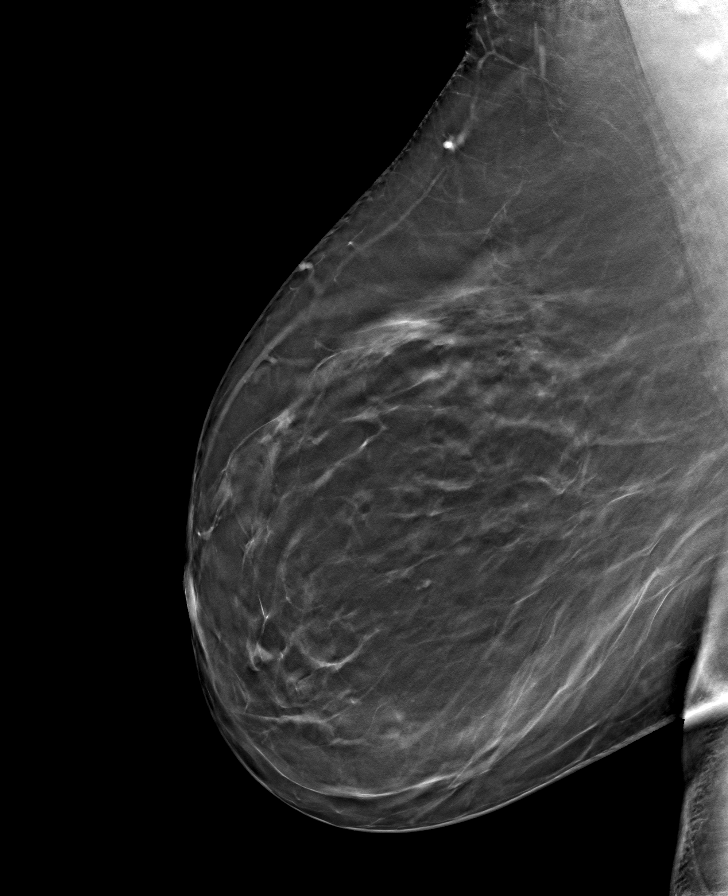

[8 of 24 positions shown; findings below may reference images not displayed]

ACR Breast Density Category b: There are scattered areas of
fibroglandular density.
FINDINGS: There are no findings suspicious for malignancy.
IMPRESSION: No mammographic evidence of malignancy. A result letter of this
screening mammogram will be mailed directly to the patient.

RECOMMENDATION:
Screening mammogram in one year. (Code:51-O-LD2)

BI-RADS CATEGORY  1: Negative.

## 2022-10-20 ENCOUNTER — Encounter: Payer: Medicare Other | Admitting: *Deleted

## 2022-10-20 DIAGNOSIS — Z006 Encounter for examination for normal comparison and control in clinical research program: Secondary | ICD-10-CM

## 2022-10-20 MED ORDER — STUDY - VICTORION-1 PREVENT - INCLISIRAN 300 MG/1.5 ML OR PLACEBO SQ INJECTION (PI-STUCKEY)
300.0000 mg | INJECTION | SUBCUTANEOUS | Status: DC
  Administered 2022-10-20: 300 mg via SUBCUTANEOUS
  Filled 2022-10-20: qty 1.5

## 2022-10-20 NOTE — Research (Addendum)
V1P Study Drug Administration  Subject ID #: [id number] Kit #: [id number] Administration Site: right lower abd DATE: 20-October-2022   Visit: V 2         Prior or concomitant medications reviewed [x]   Lipid lowering medications reviewed [x]   Surgical history reviewed [x]   TIME: BP: PULSE:  0801 127/90 66  0804 131/77 71  0810 128/55 66  Mean BP: 128/74  Mean Pulse:67  Labs collected at: 0850 Fasting Lipid Profile [x]  Fasting Lp(a) [x]  Liver function tests []  Exploratory biomarkers []   AE/SAE assessment completed [x]   Study drug administered [x]   Endpoint assessment completed [x]    Ms Nest is here for V2 of V1P research. She reports feeling good. No A/E's to report. No changes in her meds. No visits to the Ed or Urgent care since last seen. Next appointment scheduled for Dec 2 at 0800.  V1P Informed Consent  Protocol No. [id number] ICF Version number: 01.01.04    Re-consent Subject met inclusion and exclusion criteria.  The informed consent form, study requirements and expectations were reviewed with the subject and questions and concerns were addressed prior to the signing of the consent form.  The subject verbalized understanding of the trial requirements.  The subject agreed to participate in the Victorion 1 Prevent trial and signed the informed consent on 10-20-22  at 0815.   A copy of the signed informed consent was given to the subject and original copy was placed in the subject's medical record.     Current Outpatient Medications:    cholecalciferol (VITAMIN D3) 25 MCG (1000 UNIT) tablet, Take 2,000 Units by mouth daily., Disp: , Rfl:    glimepiride (AMARYL) 1 MG tablet, Take 0.5 mg by mouth every morning., Disp: , Rfl:    irbesartan (AVAPRO) 150 MG tablet, Take 150 mg by mouth daily., Disp: , Rfl:    metFORMIN (GLUCOPHAGE) 1000 MG tablet, Take 1,000 mg by mouth 2 (two) times daily with a meal., Disp: , Rfl:    psyllium (METAMUCIL) 58.6 % powder, Take 1  packet by mouth daily. (Patient not taking: Reported on 06/15/2022), Disp: , Rfl:    rosuvastatin (CRESTOR) 40 MG tablet, Take 1 tablet (40 mg total) by mouth at bedtime., Disp: 90 tablet, Rfl: 3  Current Facility-Administered Medications:    Study - VICTORION-1 PREVENT - inclisiran 300 mg/1.80mL or placebo SQ injection (PI-Stuckey), 300 mg, Subcutaneous, Q6 months, Jodelle Red, MD

## 2022-10-26 NOTE — Research (Cosign Needed)
Are there any labs that are clinically significant?  Yes []  OR No[x]   ACCESSION NO. [id number]                                             Page 1 of 1                        [id number]                   INVESTIGATOR: ([id number])                          PROTOCOL   [id number]                     Shawnie Pons M.D.                            INVESTIGATOR NO.: [id number]                     c/o [facility]                           PATIENT NUMBER: [id number]                     [facility]                  SUBJECT INITIALS NOT COLLECTED:                     [address]                          VISIT: [place],  [place] [address] V2/V4                   SPONSOR REPORT TO:                 COLLECTION TIME:08:50 DATE:20-Oct-2022                     Catrin Deck                      DATE RECEIVED IN LABORATORY: 21-Oct-2022                     c/o Theodosia Blender              DATE REPORTED BY LABORATORY: 21-Oct-2022                     [employer]                         SEX: F  BIRTHDATE:  28-Apr-1955    AGE: 67y                     [facility] [address], [place] [address]  Ref. Ranges               Clinical    Comments                                                                          Significance                                                                            Yes*  No                    LDL-C ALERT                      LDLC Alert     Criteria not met       IS SUBJECT FASTING?                      Fasting?       Yes

## 2022-12-31 DIAGNOSIS — I2510 Atherosclerotic heart disease of native coronary artery without angina pectoris: Secondary | ICD-10-CM | POA: Diagnosis not present

## 2022-12-31 DIAGNOSIS — R809 Proteinuria, unspecified: Secondary | ICD-10-CM | POA: Diagnosis not present

## 2022-12-31 DIAGNOSIS — E1169 Type 2 diabetes mellitus with other specified complication: Secondary | ICD-10-CM | POA: Diagnosis not present

## 2022-12-31 DIAGNOSIS — E119 Type 2 diabetes mellitus without complications: Secondary | ICD-10-CM | POA: Diagnosis not present

## 2022-12-31 DIAGNOSIS — Z79899 Other long term (current) drug therapy: Secondary | ICD-10-CM | POA: Diagnosis not present

## 2022-12-31 DIAGNOSIS — I10 Essential (primary) hypertension: Secondary | ICD-10-CM | POA: Diagnosis not present

## 2022-12-31 DIAGNOSIS — E7800 Pure hypercholesterolemia, unspecified: Secondary | ICD-10-CM | POA: Diagnosis not present

## 2023-02-03 DIAGNOSIS — M25571 Pain in right ankle and joints of right foot: Secondary | ICD-10-CM | POA: Diagnosis not present

## 2023-03-29 ENCOUNTER — Encounter: Payer: Medicare Other | Admitting: *Deleted

## 2023-03-29 DIAGNOSIS — Z006 Encounter for examination for normal comparison and control in clinical research program: Secondary | ICD-10-CM

## 2023-03-29 MED ORDER — STUDY - VICTORION-1 PREVENT - INCLISIRAN 300 MG/1.5 ML OR PLACEBO SQ INJECTION (PI-STUCKEY)
300.0000 mg | INJECTION | SUBCUTANEOUS | Status: AC
  Administered 2023-03-29: 300 mg via SUBCUTANEOUS
  Filled 2023-03-29: qty 1.5

## 2023-03-29 MED ORDER — STUDY - VICTORION-1 PREVENT - INCLISIRAN 300 MG/1.5 ML OR PLACEBO SQ INJECTION (PI-STUCKEY): 300.0000 mg | INJECTION | SUBCUTANEOUS | Status: DC

## 2023-03-29 NOTE — Research (Signed)
      DATE: 29-Mar-2023  SUBJECT ID: [id number]   Visit: 3        Day: 270          Prior or concomitant medications reviewed [x]   Lipid lowering medications reviewed [x]   Surgical history reviewed [x]   TIME: BP: PULSE:  0805 127/65 63  0807 135/67 63  0808 125/58 64  Mean BP: 129/63   Mean Pulse: 63  Labs collected at: 0825 Fasting Lipid Profile [x]  Fasting Lp(a) []  Liver function tests []  Exploratory biomarkers []   AE/SAE assessment completed [x]   Study drug administered [x]   Endpoint assessment completed [x]    Ms Hindes is here for visit 3. She reports no abd pain, no changes in her meds, and no visits to the ed or urgent care since last seen. Injection given in left lower abd. Tol well kit number [id number].   Current Outpatient Medications:    cholecalciferol (VITAMIN D3) 25 MCG (1000 UNIT) tablet, Take 2,000 Units by mouth daily., Disp: , Rfl:    glimepiride (AMARYL) 1 MG tablet, Take 0.5 mg by mouth every morning., Disp: , Rfl:    irbesartan (AVAPRO) 150 MG tablet, Take 150 mg by mouth daily., Disp: , Rfl:    metFORMIN (GLUCOPHAGE) 1000 MG tablet, Take 1,000 mg by mouth 2 (two) times daily with a meal., Disp: , Rfl:    psyllium (METAMUCIL) 58.6 % powder, Take 1 packet by mouth daily. (Patient not taking: Reported on 06/15/2022), Disp: , Rfl:    rosuvastatin (CRESTOR) 40 MG tablet, Take 1 tablet (40 mg total) by mouth at bedtime., Disp: 90 tablet, Rfl: 3  Current Facility-Administered Medications:    Study - VICTORION-1 PREVENT - inclisiran 300 mg/1.66mL or placebo SQ injection (PI-Stuckey), 300 mg, Subcutaneous, Q6 months, , 300 mg at 03/29/23 0831

## 2023-04-07 ENCOUNTER — Ambulatory Visit (HOSPITAL_COMMUNITY): Payer: Medicare Other | Attending: Cardiovascular Disease

## 2023-04-07 DIAGNOSIS — I350 Nonrheumatic aortic (valve) stenosis: Secondary | ICD-10-CM | POA: Insufficient documentation

## 2023-04-07 LAB — ECHOCARDIOGRAM COMPLETE
AR max vel: 0.65 cm2
AV Area VTI: 0.79 cm2
AV Area mean vel: 0.71 cm2
AV Mean grad: 37 mm[Hg]
AV Peak grad: 62.4 mm[Hg]
Ao pk vel: 3.95 m/s
Area-P 1/2: 2.7 cm2
S' Lateral: 2.3 cm

## 2023-04-08 NOTE — Progress Notes (Signed)
Cardiology Office Note:  .   Date:  04/09/2023  ID:  [name], DOB 1955-09-02, MRN [id number] PCP: Darrow Bussing, MD  [facility] Providers Cardiologist:  Reatha Harps, MD    History of Present Illness: [name] is a 67 y.o. female with history of DM, AS, CAD, HTN, HLD who presents for follow-up.   History of Present Illness   Miss Neyens, a 67 year old female with a history of diabetes and elevated coronary calcium score, presents for follow-up regarding her severe aortic stenosis. She reports no chest pain or trouble breathing, but has experienced a feeling of tightness in her chest, which she attributes to a recent cold and associated coughing. She has been active, remodeling her home, and while she does take breaks when going up and down the stairs, she does not report any unexpected shortness of breath or fatigue.  She is currently participating in a research study for her hyperlipidemia, which has resulted in her cholesterol not being rechecked since March. She is unsure if she is receiving the study drug or a placebo. She reports joint discomfort, which started about six months ago, after her Crestor dosage was doubled. She has been advised to reduce the Crestor dosage back to the previous level.          Problem List Aortic stenosis -severe 04/07/2023 2. DM -A1c 6.3 3. HLD -T chol 204, HDL 53, LDL 133, TG 102 4. HTN 5. CAD -CAC 130 (82nd percentile)    ROS: All other ROS reviewed and negative. Pertinent positives noted in the HPI.     Studies Reviewed: Marland Kitchen   EKG Interpretation Date/Time:  Friday April 09 2023 14:20:38 EST Ventricular Rate:  74 PR Interval:  130 QRS Duration:  68 QT Interval:  378 QTC Calculation: 419 R Axis:   7  Text Interpretation: Normal sinus rhythm Normal ECG Confirmed by Lennie Odor [phone]) on 04/09/2023 2:36:33 PM   Physical Exam:   VS:  BP 136/74   Pulse 74   Ht 4' 11.5" (1.511 m)   Wt 173 lb 9.6  oz (78.7 kg)   SpO2 100%   BMI 34.48 kg/m    Wt Readings from Last 3 Encounters:  04/09/23 173 lb 9.6 oz (78.7 kg)  03/29/23 172 lb (78 kg)  10/20/22 168 lb (76.2 kg)    GEN: Well nourished, well developed in no acute distress NECK: No JVD; No carotid bruits CARDIAC: RRR, 3/6 SEM, S2 is present, rubs, gallops RESPIRATORY:  Clear to auscultation without rales, wheezing or rhonchi  ABDOMEN: Soft, non-tender, non-distended EXTREMITIES:  No edema; No deformity  ASSESSMENT AND PLAN: .   Assessment and Plan    Moderate to Severe Aortic Stenosis Progression noted on recent echocardiogram, but asymptomatic and physical exam suggests moderate stenosis. -MG 25->37 mmHG.  -Repeat echocardiogram in 6 months. -Monitor for symptoms of chest pain or shortness of breath.  Hyperlipidemia Coronary Calcium 130 (82nd percentile) Currently in a research study and experiencing joint discomfort on Crestor 40mg . -Reduce Crestor to 20mg . -Continue monitoring cholesterol levels through research study. -Reevaluate cholesterol management after completion of research study.  Hypertension Well controlled. -Continue current management.  Follow-up in 6 months after repeat echocardiogram.              Follow-up: Return in about 6 months (around 10/08/2023).  Time Spent with Patient: I have spent a total of 35 minutes caring for this patient today face to face, ordering and reviewing  labs/tests, reviewing prior records/medical history, examining the patient, establishing an assessment and plan, communicating results/findings to the patient/family, and documenting in the medical record.   Signed, Lenna Gilford. Flora Lipps, MD, [facility]  [facility]  [address] [place], [place] [address] [phone]  3:17 PM

## 2023-04-09 ENCOUNTER — Ambulatory Visit: Payer: Medicare Other | Attending: Cardiovascular Disease | Admitting: Cardiovascular Disease

## 2023-04-09 ENCOUNTER — Encounter: Payer: Self-pay | Admitting: Cardiovascular Disease

## 2023-04-09 VITALS — BP 136/74 | HR 74 | Ht 59.5 in | Wt 173.6 lb

## 2023-04-09 DIAGNOSIS — E782 Mixed hyperlipidemia: Secondary | ICD-10-CM | POA: Diagnosis not present

## 2023-04-09 DIAGNOSIS — R931 Abnormal findings on diagnostic imaging of heart and coronary circulation: Secondary | ICD-10-CM | POA: Diagnosis not present

## 2023-04-09 DIAGNOSIS — I350 Nonrheumatic aortic (valve) stenosis: Secondary | ICD-10-CM

## 2023-04-09 MED ORDER — ROSUVASTATIN CALCIUM 20 MG PO TABS: 20.0000 mg | ORAL_TABLET | Freq: Every day | ORAL | 3 refills | Status: AC

## 2023-04-09 NOTE — Patient Instructions (Signed)
Medication Instructions:  Decrease: Rosuvastatin (Crestor) to 20 mg once daily at bedtime *If you need a refill on your cardiac medications before your next appointment, please call your pharmacy*  Lab Work: None  Testing/Procedures: Your physician has requested that you have an echocardiogram in about 6 months, prior to seeing Dr. Flora Lipps. Echocardiography is a painless test that uses sound waves to create images of your heart. It provides your doctor with information about the size and shape of your heart and how well your heart's chambers and valves are working. This procedure takes approximately one hour. There are no restrictions for this procedure. Please do NOT wear cologne, perfume, aftershave, or lotions (deodorant is allowed). Please arrive 15 minutes prior to your appointment time. This will take place at [address] [place]   Follow-Up: At [facility], you and your health needs are our priority.  As part of our continuing mission to provide you with exceptional heart care, we have created designated Provider Care Teams.  These Care Teams include your primary Cardiologist (physician) and Advanced Practice Providers (APPs -  Physician Assistants and Nurse Practitioners) who all work together to provide you with the care you need, when you need it.  Your next appointment:   6 month(s)  Provider:   Reatha Harps, MD

## 2023-05-01 ENCOUNTER — Other Ambulatory Visit: Payer: Self-pay | Admitting: Cardiovascular Disease

## 2023-06-29 ENCOUNTER — Other Ambulatory Visit: Payer: Self-pay | Admitting: Family Medicine

## 2023-06-29 DIAGNOSIS — Z1231 Encounter for screening mammogram for malignant neoplasm of breast: Secondary | ICD-10-CM

## 2023-07-12 DIAGNOSIS — E119 Type 2 diabetes mellitus without complications: Secondary | ICD-10-CM | POA: Diagnosis not present

## 2023-07-12 DIAGNOSIS — H2513 Age-related nuclear cataract, bilateral: Secondary | ICD-10-CM | POA: Diagnosis not present

## 2023-07-12 DIAGNOSIS — H52203 Unspecified astigmatism, bilateral: Secondary | ICD-10-CM | POA: Diagnosis not present

## 2023-07-15 DIAGNOSIS — Z0001 Encounter for general adult medical examination with abnormal findings: Secondary | ICD-10-CM | POA: Diagnosis not present

## 2023-07-15 DIAGNOSIS — I350 Nonrheumatic aortic (valve) stenosis: Secondary | ICD-10-CM | POA: Diagnosis not present

## 2023-07-15 DIAGNOSIS — E7800 Pure hypercholesterolemia, unspecified: Secondary | ICD-10-CM | POA: Diagnosis not present

## 2023-07-15 DIAGNOSIS — Z23 Encounter for immunization: Secondary | ICD-10-CM | POA: Diagnosis not present

## 2023-07-15 DIAGNOSIS — Z79899 Other long term (current) drug therapy: Secondary | ICD-10-CM | POA: Diagnosis not present

## 2023-07-15 DIAGNOSIS — E119 Type 2 diabetes mellitus without complications: Secondary | ICD-10-CM | POA: Diagnosis not present

## 2023-07-29 ENCOUNTER — Ambulatory Visit
Admission: RE | Admit: 2023-07-29 | Discharge: 2023-07-29 | Disposition: A | Discharge status: Home / Self Care | Source: Ambulatory Visit | Attending: Family Medicine | Admitting: Family Medicine

## 2023-07-29 DIAGNOSIS — Z1231 Encounter for screening mammogram for malignant neoplasm of breast: Secondary | ICD-10-CM

## 2023-08-26 ENCOUNTER — Encounter: Payer: Self-pay | Admitting: *Deleted

## 2023-08-26 DIAGNOSIS — Z006 Encounter for examination for normal comparison and control in clinical research program: Secondary | ICD-10-CM

## 2023-08-26 NOTE — Research (Signed)
 I received an email from Ms Hogan stating she would like to come off treatment with V1P research study. I informed her Dr Deanna Expose' Andree Kayser is ok with her continuing in the study. I also informed her that her labs were being monitored by the research study and if abnormal we would be notified. She wants to come out so all her Drs have access to all labs. V1P monitor informed.

## 2023-08-30 ENCOUNTER — Encounter: Payer: Self-pay | Admitting: Cardiovascular Disease

## 2023-09-24 ENCOUNTER — Encounter: Payer: Self-pay | Admitting: *Deleted

## 2023-09-24 DIAGNOSIS — Z006 Encounter for examination for normal comparison and control in clinical research program: Secondary | ICD-10-CM

## 2023-09-24 NOTE — Research (Addendum)
 Text sent to [name] for visit 4. She is doing well, no changes in her meds, no visits to the ed or urgent care, and no abd pain. She states she is scheduled for an echocardiogram next Tuesday. June 3,2025.  Lifestyle instructions reviewed with [name].  Next visit will be Nov. 2025.

## 2023-09-28 ENCOUNTER — Ambulatory Visit (HOSPITAL_COMMUNITY)
Admission: RE | Admit: 2023-09-28 | Discharge: 2023-09-28 | Disposition: A | Discharge status: Home / Self Care | Payer: Medicare Other | Source: Ambulatory Visit | Attending: Cardiovascular Disease | Admitting: Cardiovascular Disease

## 2023-09-28 DIAGNOSIS — I350 Nonrheumatic aortic (valve) stenosis: Secondary | ICD-10-CM

## 2023-09-28 LAB — ECHOCARDIOGRAM COMPLETE
AR max vel: 0.86 cm2
AV Area VTI: 0.93 cm2
AV Area mean vel: 0.82 cm2
AV Mean grad: 39.3 mmHg
AV Peak grad: 67.1 mmHg
Ao pk vel: 4.1 m/s
Area-P 1/2: 3.62 cm2
S' Lateral: 2.4 cm

## 2023-09-29 ENCOUNTER — Ambulatory Visit: Payer: Self-pay | Admitting: Cardiovascular Disease

## 2023-10-04 ENCOUNTER — Ambulatory Visit: Payer: Medicare Other | Admitting: Cardiovascular Disease

## 2023-10-07 NOTE — Progress Notes (Unsigned)
 Cardiology Office Note    Date:  10/09/2023  ID:  [name], DOB 1955-08-02, MRN [id number] PCP:  Lanae Pinal, MD  Cardiologist:  Oneil Bigness, MD  Electrophysiologist:  None   Chief Complaint: Follow up for aortic valve stenosis   History of Present Illness: [name] is a 68 y.o. female with visit-pertinent history of diabetes mellitus, hypertension, hyperlipidemia and aortic stenosis.  Patient first evaluated by Dr. Rolm Clos on 04/15/22 for aortic stenosis.  Patient had recently been evaluated for research study for hyperlipidemia, echocardiogram that time showed moderate aortic stenosis.  She denied any chest pain or trouble breathing at that time.  CT cardiac scoring indicated a coronary calcium  score of 130, this was 82nd percentile for age, race and sex matched controls.  Patient was last in clinic on 04/09/2023,, patient had reported feeling a slight tightness in her chest which she attributed to a recent cold and associated coughing.  As noted she been active remodeling her home she did not report any unexpected shortness of breath or fatigue.  Echocardiogram on 09/28/2023 indicated LVEF of 65 to 70%, no RWMA, G1 DD, RV systolic function and size was normal, normal PASP, mild mitral valve regurgitation with no evidence of stenosis.  There was severe calcification of the aortic valve with severe aortic valve stenosis and trivial regurgitation.  Today she presents for follow-up.  She reports that she is doing ok, she does note increased shorntess of breath, that has progressed in recent months.  She denies any chest pain, lower extremity edema, orthopnea or PND.  She presents today with her son, she notes that her husband was recently diagnosed with cancer and is currently going through chemotherapy and radiation.  Patient would like to be referred to structural heart clinic for ongoing discussion about her aortic valve stenosis.  She also reports that she is no  longer in a research study for hyperlipidemia.  ROS: .   Today she denies chest pain, lower extremity edema, fatigue, palpitations, melena, hematuria, hemoptysis, diaphoresis, weakness, presyncope, syncope, orthopnea, and PND.  All other systems are reviewed and otherwise negative. Studies Reviewed: Aaron Vargas    EKG:  EKG is ordered today, personally reviewed, demonstrating  EKG Interpretation Date/Time:  Friday October 08 2023 14:59:16 EDT Ventricular Rate:  90 PR Interval:  140 QRS Duration:  72 QT Interval:  352 QTC Calculation: 430 R Axis:   -7  Text Interpretation: Normal sinus rhythm Minimal voltage criteria for LVH, may be normal variant ( R in aVL ) Cannot rule out Anterior infarct , age undetermined When compared with ECG of 09-Apr-2023 14:20, No significant change was found Confirmed by Ashkan Chamberland [phone]) on 10/09/2023 7:11:48 PM   CV Studies: Cardiac studies reviewed are outlined and summarized above. Otherwise please see EMR for full report. Cardiac Studies & Procedures   ______________________________________________________________________________________________     ECHOCARDIOGRAM  ECHOCARDIOGRAM COMPLETE 09/28/2023  Narrative ECHOCARDIOGRAM REPORT    Patient Name:   [name] Date of Exam: 09/28/2023 Medical Rec #:  [id number]         Height:       59.5 in Accession #:    [id number]        Weight:       173.6 lb Date of Birth:  1956/02/09        BSA:          1.747 m Patient Age:    59 years  BP:           136/74 mmHg Patient Gender: F                 HR:           80 bpm. Exam Location:  [address]  Procedure: 2D Echo, Cardiac Doppler, Color Doppler, 3D Echo and Strain Analysis (Both Spectral and Color Flow Doppler were utilized during procedure).  Indications:    Aortic Stenosis I35.0  History:        Patient has prior history of Echocardiogram examinations, most recent 04/07/2023. Risk Factors:Hypertension and Diabetes.  Sonographer:    Joleen Navy RDCS Referring Phys: [id number] [facility]  IMPRESSIONS   1. Left ventricular ejection fraction, by estimation, is 65 to 70%. Left ventricular ejection fraction by 3D volume is 65 %. The left ventricle has normal function. The left ventricle has no regional wall motion abnormalities. Left ventricular diastolic parameters are consistent with Grade I diastolic dysfunction (impaired relaxation). The average left ventricular global longitudinal strain is -21.2 %. The global longitudinal strain is normal. 2. Right ventricular systolic function is normal. The right ventricular size is normal. There is normal pulmonary artery systolic pressure. 3. The mitral valve is normal in structure. Mild mitral valve regurgitation. No evidence of mitral stenosis. 4. The aortic valve is calcified. There is severe calcifcation of the aortic valve. There is moderate thickening of the aortic valve. Aortic valve regurgitation is trivial. Severe aortic valve stenosis. Aortic valve area, by VTI measures 0.93 cm. Aortic valve mean gradient measures 39.3 mmHg. Aortic valve Vmax measures 4.10 m/s. 5. The inferior vena cava is normal in size with greater than 50% respiratory variability, suggesting right atrial pressure of 3 mmHg.  Comparison(s): Prior images reviewed side by side. ECHO 2024 AV mean gradient 37 mmHg.  FINDINGS Left Ventricle: Left ventricular ejection fraction, by estimation, is 65 to 70%. Left ventricular ejection fraction by 3D volume is 65 %. The left ventricle has normal function. The left ventricle has no regional wall motion abnormalities. The average left ventricular global longitudinal strain is -21.2 %. Strain was performed and the global longitudinal strain is normal. The left ventricular internal cavity size was normal in size. There is no left ventricular hypertrophy. Left ventricular diastolic parameters are consistent with Grade I diastolic dysfunction (impaired  relaxation).  Right Ventricle: The right ventricular size is normal. No increase in right ventricular wall thickness. Right ventricular systolic function is normal. There is normal pulmonary artery systolic pressure. The tricuspid regurgitant velocity is 2.41 m/s, and with an assumed right atrial pressure of 3 mmHg, the estimated right ventricular systolic pressure is 26.2 mmHg.  Left Atrium: Left atrial size was normal in size.  Right Atrium: Right atrial size was normal in size.  Pericardium: There is no evidence of pericardial effusion.  Mitral Valve: The mitral valve is normal in structure. Mild mitral valve regurgitation. No evidence of mitral valve stenosis.  Tricuspid Valve: The tricuspid valve is normal in structure. Tricuspid valve regurgitation is not demonstrated. No evidence of tricuspid stenosis.  Aortic Valve: The aortic valve is calcified. There is severe calcifcation of the aortic valve. There is moderate thickening of the aortic valve. Aortic valve regurgitation is trivial. Severe aortic stenosis is present. Aortic valve mean gradient measures 39.3 mmHg. Aortic valve peak gradient measures 67.1 mmHg. Aortic valve area, by VTI measures 0.93 cm.  Pulmonic Valve: The pulmonic valve was normal in structure. Pulmonic valve regurgitation is mild. No  evidence of pulmonic stenosis.  Aorta: The aortic root is normal in size and structure.  Venous: The inferior vena cava is normal in size with greater than 50% respiratory variability, suggesting right atrial pressure of 3 mmHg.  IAS/Shunts: No atrial level shunt detected by color flow Doppler.  Additional Comments: 3D was performed not requiring image post processing on an independent workstation and was normal.   LEFT VENTRICLE PLAX 2D LVIDd:         4.50 cm         Diastology LVIDs:         2.40 cm         LV e' medial:    6.66 cm/s LV PW:         1.00 cm         LV E/e' medial:  15.8 LV IVS:        1.00 cm         LV e'  lateral:   6.92 cm/s LVOT diam:     2.00 cm         LV E/e' lateral: 15.2 LV SV:         88 LV SV Index:   51              2D Longitudinal LVOT Area:     3.14 cm        Strain 2D Strain GLS   -20.8 % (A4C): 2D Strain GLS   -21.9 % (A3C): 2D Strain GLS   -20.8 % (A2C): 2D Strain GLS   -21.2 % Avg:  3D Volume EF LV 3D EF:    Left ventricul ar ejection fraction by 3D volume is 65 %.  3D Volume EF: 3D EF:        65 % LV EDV:       99 ml LV ESV:       35 ml LV SV:        64 ml  RIGHT VENTRICLE             IVC RV Basal diam:  2.90 cm     IVC diam: 1.30 cm RV Mid diam:    2.80 cm RV S prime:     11.50 cm/s TAPSE (M-mode): 2.4 cm  LEFT ATRIUM           Index        RIGHT ATRIUM           Index LA diam:      3.70 cm 2.12 cm/m   RA Area:     10.20 cm LA Vol (A2C): 48.4 ml 27.71 ml/m  RA Volume:   19.40 ml  11.11 ml/m LA Vol (A4C): 36.2 ml 20.72 ml/m AORTIC VALVE AV Area (Vmax):    0.86 cm AV Area (Vmean):   0.82 cm AV Area (VTI):     0.93 cm AV Vmax:           409.67 cm/s AV Vmean:          296.667 cm/s AV VTI:            0.954 m AV Peak Grad:      67.1 mmHg AV Mean Grad:      39.3 mmHg LVOT Vmax:         112.00 cm/s LVOT Vmean:        77.800 cm/s LVOT VTI:          0.281 m LVOT/AV VTI ratio: 0.29  AORTA Ao  Root diam: 2.00 cm Ao Asc diam:  3.80 cm  MITRAL VALVE                TRICUSPID VALVE MV Area (PHT): 3.62 cm     TR Peak grad:   23.2 mmHg MV Decel Time: 210 msec     TR Vmax:        241.00 cm/s MV E velocity: 105.50 cm/s MV A velocity: 123.00 cm/s  SHUNTS MV E/A ratio:  0.86         Systemic VTI:  0.28 m Systemic Diam: 2.00 cm  Dorothye Gathers MD Electronically signed by Dorothye Gathers MD Signature Date/Time: 09/28/2023/2:26:38 PM    Final      CT SCANS  CT CARDIAC SCORING (SELF PAY ONLY) 04/28/2022  Addendum 04/29/2022 10:24 AM ADDENDUM REPORT: 04/29/2022 10:21  EXAM: OVER-READ INTERPRETATION  CT CHEST  The following report is an over-read  performed by radiologist Dr. Melinda Blietzof [facility] on 04/29/2022. This over-read does not include interpretation of cardiac or coronary anatomy or pathology. The interpretation by the cardiologist is attached.  COMPARISON:  None.  FINDINGS: Scout view is unremarkable. Atherosclerotic calcification of the aorta. Calcified right lower lobe granuloma.  IMPRESSION: 1. No acute extracardiac findings. 2.  Aortic atherosclerosis (ICD10-I70.0).   Electronically Signed By: Shearon Denis M.D. On: 04/29/2022 10:21  Narrative CLINICAL DATA:  Cardiovascular Disease Risk stratification  EXAM: Coronary Calcium  Score  TECHNIQUE: A gated, non-contrast computed tomography scan of the heart was performed using 3mm slice thickness. Axial images were analyzed on a dedicated workstation. Calcium  scoring of the coronary arteries was performed using the Agatston method.  FINDINGS: Coronary arteries: Normal origins.  Coronary Calcium  Score:  Left main: 0  Left anterior descending artery: 57  Left circumflex artery: 21  Right coronary artery: 52  Total: 130  Percentile: 82  Pericardium: Normal.  Aorta: Normal caliber of ascending aorta. Aortic atherosclerosis noted, prominent in the aortic root and aortic arch. Calcification of aortic valve also noted.  Non-cardiac: See separate report from [facility].  IMPRESSION: Coronary calcium  score of 130. This was 82nd percentile for age-, race-, and sex-matched controls. Aortic atherosclerosis, prominent in the aortic root and aortic arch. Calcification of aortic valve also noted.  RECOMMENDATIONS: Coronary artery calcium  (CAC) score is a strong predictor of incident coronary heart disease (CHD) and provides predictive information beyond traditional risk factors. CAC scoring is reasonable to use in the decision to withhold, postpone, or initiate statin therapy in intermediate-risk or selected  borderline-risk asymptomatic adults (age 68-75 years and LDL-C >=70 to <190 mg/dL) who do not have diabetes or established atherosclerotic cardiovascular disease (ASCVD).* In intermediate-risk (10-year ASCVD risk >=7.5% to <20%) adults or selected borderline-risk (10-year ASCVD risk >=5% to <7.5%) adults in whom a CAC score is measured for the purpose of making a treatment decision the following recommendations have been made:  If CAC=0, it is reasonable to withhold statin therapy and reassess in 5 to 10 years, as long as higher risk conditions are absent (diabetes mellitus, family history of premature CHD in first degree relatives (males <55 years; females <65 years), cigarette smoking, or LDL >=190 mg/dL).  If CAC is 1 to 99, it is reasonable to initiate statin therapy for patients >=53 years of age.  If CAC is >=100 or >=75th percentile, it is reasonable to initiate statin therapy at any age.  Cardiology referral should be considered for patients with CAC scores >=400 or >=75th percentile.  *2018 AHA/ACC/AACVPR/AAPA/ABC/ACPM/ADA/AGS/APhA/ASPC/NLA/PCNA  Guideline on the Management of Blood Cholesterol: A Report of the [employer] of Cardiology/American Heart Association Task Force on Clinical Practice Guidelines. J Am Coll Cardiol. 2019;73(24):3168-3209.  Sheryle Donning, MD  Electronically Signed: By: Sheryle Donning M.D. On: 04/29/2022 08:04     ______________________________________________________________________________________________       Current Reported Medications:.    Current Meds  Medication Sig   cholecalciferol (VITAMIN D3) 25 MCG (1000 UNIT) tablet Take 2,000 Units by mouth daily.   glimepiride (AMARYL) 1 MG tablet Take 0.5 mg by mouth every morning.   irbesartan (AVAPRO) 150 MG tablet Take 150 mg by mouth daily.   metFORMIN (GLUCOPHAGE) 1000 MG tablet Take 1,000 mg by mouth 2 (two) times daily with a meal.   rosuvastatin  (CRESTOR )  20 MG tablet Take 1 tablet (20 mg total) by mouth at bedtime.   Current Facility-Administered Medications for the 10/08/23 encounter (Office Visit) with Dak Szumski D, NP  Medication   Study - VICTORION-1 PREVENT - inclisiran 300 mg/1.5mL or placebo SQ injection (PI-[facility])   Physical Exam:    VS:  BP 136/84   Pulse 84   Ht 4' 11.5 (1.511 m)   Wt 175 lb 12.8 oz (79.7 kg)   SpO2 96%   BMI 34.91 kg/m    Wt Readings from Last 3 Encounters:  10/08/23 175 lb 12.8 oz (79.7 kg)  04/09/23 173 lb 9.6 oz (78.7 kg)  03/29/23 172 lb (78 kg)    GEN: Well nourished, well developed in no acute distress NECK: No JVD; No carotid bruits CARDIAC: RRR, 3/6 systolic murmur, no rubs or gallops RESPIRATORY:  Clear to auscultation without rales, wheezing or rhonchi  ABDOMEN: Soft, non-tender, non-distended EXTREMITIES:  No edema; No acute deformity     Asessement and Plan:.    Severe aortic stenosis: Echocardiogram on 09/28/2023 indicated LVEF of 65 to 70%, no RWMA, G1 DD, RV systolic function and size was normal, normal PASP, mild mitral valve regurgitation with no evidence of stenosis.  There was severe calcification of the aortic valve with severe aortic valve stenosis and trivial regurgitation.  Today she reports increased shortness of breath, notes that she is unable to walk as far as she previously did without becoming increasingly dyspneic.  She denies any chest pain.  She denies any increased lower extremity edema.  Discussed referral to structural heart team for consideration of possible TAVR, patient in agreement with this.  Coronary calcium : CT cardiac scoring indicated a coronary calcium  score of 130, this was 82nd percentile for age, race and sex matched controls.  Today she denies chest pain, reports increasing shortness of breath felt to related to aortic valve stenosis.  Reviewed ED precautions.  Continue irbesartan 150 mg daily.  Hyperlipidemia: Last lipid profile on 07/15/2023 indicated  total cholesterol 193, triglycerides 81, HDL 53 and LDL 126.  Patient reports that she is no longer in research study.  She has previously been on Lipitor and simvastatin resulting in myalgias.  She is currently on Crestor  20 mg daily, notes with increased dosing this resulted in myalgias.  Check fasting lipid profile and LFTs today.  Refer to Pharm.D. lipid clinic.  Hypertension: Initial blood pressure today 126/88 on recheck was 136/84.  Patient reports that she has not yet had a chance to take her blood pressure medications today, she will take when she gets home.  Continue irbesartan 150 mg daily.    Disposition: F/u with Dr. Elodia Hailstone in 3-4 months per patient request.   Signed, Avani Sensabaugh  Vinita Greenspan, NP

## 2023-10-08 ENCOUNTER — Ambulatory Visit: Attending: Cardiology | Admitting: Cardiology

## 2023-10-08 ENCOUNTER — Encounter: Payer: Self-pay | Admitting: Cardiology

## 2023-10-08 VITALS — BP 136/84 | HR 84 | Ht 59.5 in | Wt 175.8 lb

## 2023-10-08 DIAGNOSIS — I10 Essential (primary) hypertension: Secondary | ICD-10-CM | POA: Diagnosis not present

## 2023-10-08 DIAGNOSIS — E782 Mixed hyperlipidemia: Secondary | ICD-10-CM

## 2023-10-08 DIAGNOSIS — I350 Nonrheumatic aortic (valve) stenosis: Secondary | ICD-10-CM

## 2023-10-08 DIAGNOSIS — R931 Abnormal findings on diagnostic imaging of heart and coronary circulation: Secondary | ICD-10-CM | POA: Diagnosis not present

## 2023-10-08 NOTE — Patient Instructions (Addendum)
 Medication Instructions:  No changes *If you need a refill on your cardiac medications before your next appointment, please call your pharmacy*  Lab Work: In 2 weeks we are going to need a fasting Lipid panel and LFT If you have labs (blood work) drawn today and your tests are completely normal, you will receive your results only by: MyChart Message (if you have MyChart) OR A paper copy in the mail If you have any lab test that is abnormal or we need to change your treatment, we will call you to review the results.  Testing/Procedures: No testing  Follow-Up: At [facility], you and your health needs are our priority.  As part of our continuing mission to provide you with exceptional heart care, our providers are all part of one team.  This team includes your primary Cardiologist (physician) and Advanced Practice Providers or APPs (Physician Assistants and Nurse Practitioners) who all work together to provide you with the care you need, when you need it.  Your next appointment:   3 month(s)  Provider:   Katlyn West, NP Or Jackquelyn Mass MD  We recommend signing up for the patient portal called MyChart.  Sign up information is provided on this After Visit Summary.  MyChart is used to connect with patients for Virtual Visits (Telemedicine).  Patients are able to view lab/test results, encounter notes, upcoming appointments, etc.  Non-urgent messages can be sent to your provider as well.   To learn more about what you can do with MyChart, go to [email or web address].  Other Instructions: We are sending a Referral to our PharmD lipid clinic We are also sending a referral to Structural Heart clinic

## 2023-10-09 ENCOUNTER — Encounter: Payer: Self-pay | Admitting: Cardiology

## 2023-10-13 DIAGNOSIS — R931 Abnormal findings on diagnostic imaging of heart and coronary circulation: Secondary | ICD-10-CM | POA: Diagnosis not present

## 2023-10-13 DIAGNOSIS — E782 Mixed hyperlipidemia: Secondary | ICD-10-CM | POA: Diagnosis not present

## 2023-10-13 DIAGNOSIS — I10 Essential (primary) hypertension: Secondary | ICD-10-CM | POA: Diagnosis not present

## 2023-10-13 LAB — LIPID PANEL

## 2023-10-14 ENCOUNTER — Ambulatory Visit: Payer: Self-pay | Admitting: Cardiology

## 2023-10-14 LAB — LIPID PANEL
Cholesterol, Total: 201 mg/dL — ABNORMAL HIGH (ref 100–199)
HDL: 54 mg/dL (ref >39)
LDL CALC COMMENT:: 3.7 ratio (ref 0.0–4.4)
LDL Chol Calc (NIH): 134 mg/dL — ABNORMAL HIGH (ref 0–99)
Triglycerides: 73 mg/dL (ref 0–149)
VLDL Cholesterol Cal: 13 mg/dL (ref 5–40)

## 2023-10-14 LAB — HEPATIC FUNCTION PANEL
ALT: 13 IU/L (ref 0–32)
AST: 18 IU/L (ref 0–40)
Albumin: 4.8 g/dL (ref 3.9–4.9)
Alkaline Phosphatase: 81 IU/L (ref 44–121)
Bilirubin Total: 0.3 mg/dL (ref 0.0–1.2)
Bilirubin, Direct: 0.12 mg/dL (ref 0.00–0.40)
Total Protein: 7.2 g/dL (ref 6.0–8.5)

## 2023-11-10 ENCOUNTER — Encounter: Payer: Self-pay | Admitting: Cardiovascular Disease

## 2023-11-10 NOTE — Progress Notes (Unsigned)
 Structural Heart Clinic Consult Note  No chief complaint on file.  History of Present Illness: 68 yo female with history of DM, HLD, HTN, obesity and severe aortic stenosis who is here today as a new consult, referred by Dr. Barbaraann, for further discussion regarding her aortic stenosis. She has been followed for moderate aortic stenosis. Echo June 2025 with LVEF=65-70%. Severe aortic stenosis with mean gradient 39 mmHg, AVA 0.93 cm2, DI 0.29. Dr. Barbaraann recommended referral to the structural clinic as he felt that her aortic stenosis was most likely severe.   She tells me today that she *** She lives in *** She is retired Engineer, manufacturing ***  Primary Care Physician: Regino Slater, MD Primary Cardiologist: O'Neal Referring Cardiologist: O'Neal  Past Medical History:  Diagnosis Date   Aortic stenosis    Diabetes mellitus without complication (HCC) 2010   Diverticulitis 06/2015   High cholesterol 1987   Hypertension 2017   Obesity    Ureteral stone 06/2015    Past Surgical History:  Procedure Laterality Date   BREAST BIOPSY Left 11/10/2005   benign   CESAREAN SECTION  1978   1982 second   LAPAROSCOPIC HYSTERECTOMY Bilateral 2008   TONSILECTOMY, ADENOIDECTOMY, BILATERAL MYRINGOTOMY AND TUBES  1976    Current Outpatient Medications  Medication Sig Dispense Refill   cholecalciferol (VITAMIN D3) 25 MCG (1000 UNIT) tablet Take 2,000 Units by mouth daily.     glimepiride (AMARYL) 1 MG tablet Take 0.5 mg by mouth every morning.     irbesartan (AVAPRO) 150 MG tablet Take 150 mg by mouth daily.     metFORMIN (GLUCOPHAGE) 1000 MG tablet Take 1,000 mg by mouth 2 (two) times daily with a meal.     rosuvastatin  (CRESTOR ) 20 MG tablet Take 1 tablet (20 mg total) by mouth at bedtime. 90 tablet 3   Current Facility-Administered Medications  Medication Dose Route Frequency Provider Last Rate Last Admin   Study - VICTORION-1 PREVENT - inclisiran 300 mg/1.5mL or placebo SQ injection  (PI-Stuckey)  300 mg Subcutaneous Q6 months    300 mg at 03/29/23 0831    Allergies  Allergen Reactions   Penicillins Rash    Confirmed with patient no SOB, lip/tongue swelling    Social History   Socioeconomic History   Marital status: Married    Spouse name: Not on file   Number of children: 2   Years of education: Not on file   Highest education level: Not on file  Occupational History   Occupation: Retired Engineer, civil (consulting)  Tobacco Use   Smoking status: Former   Smokeless tobacco: Never  Vaping Use   Vaping status: Never Used  Substance and Sexual Activity   Alcohol use: Yes    Alcohol/week: 2.0 standard drinks of alcohol    Types: 2 Glasses of wine per week    Comment: on the weekends   Drug use: No   Sexual activity: Not on file  Other Topics Concern   Not on file  Social History Narrative   Not on file   Social Drivers of Health   Financial Resource Strain: Not on file  Food Insecurity: No Food Insecurity (02/12/2022)   Hunger Vital Sign    Worried About Running Out of Food in the Last Year: Never true    Ran Out of Food in the Last Year: Never true  Transportation Needs: No Transportation Needs (02/12/2022)   PRAPARE - Transportation    Lack of Transportation (Medical): No    Lack  of Transportation (Non-Medical): No  Physical Activity: Not on file  Stress: Not on file  Social Connections: Unknown (09/09/2021)   Received from [facility]   Social Network    Social Network: Not on file  Intimate Partner Violence: Unknown (08/01/2021)   Received from [facility]   HITS    Physically Hurt: Not on file    Insult or Talk Down To: Not on file    Threaten Physical Harm: Not on file    Scream or Curse: Not on file    Family History  Problem Relation Age of Onset   Hyperlipidemia Mother    High Cholesterol Mother    Diabetes Mother    Cancer Mother    Coronary artery disease Mother    Cancer Father    Hypertension Sister    Kidney disease Sister     Diabetes Sister     Review of Systems:  As stated in the HPI and otherwise negative.   There were no vitals taken for this visit.  Physical Examination: General: Well developed, well nourished, NAD  HEENT: OP clear, mucus membranes moist  SKIN: warm, dry. No rashes. Neuro: No focal deficits  Musculoskeletal: Muscle strength 5/5 all ext  Psychiatric: Mood and affect normal  Neck: No JVD, no carotid bruits, no thyromegaly, no lymphadenopathy.  Lungs:Clear bilaterally, no wheezes, rhonci, crackles Cardiovascular: Regular rate and rhythm. *** Loud, harsh, late peaking systolic murmur.  Abdomen:Soft. Bowel sounds present. Non-tender.  Extremities: *** No lower extremity edema. Pulses are 2 + in the bilateral DP/PT.  EKG:  EKG {ACTION; IS/IS [id number]} ordered today. The ekg ordered today demonstrates ***  Echo June 2025:  1. Left ventricular ejection fraction, by estimation, is 65 to 70%. Left ventricular ejection fraction by 3D volume is 65 %. The left ventricle has normal function. The left ventricle has no regional wall motion abnormalities. Left ventricular diastolic  parameters are consistent with Grade I diastolic dysfunction (impaired relaxation). The average left ventricular global longitudinal strain is -21.2 %. The global longitudinal strain is normal.  2. Right ventricular systolic function is normal. The right ventricular size is normal. There is normal pulmonary artery systolic pressure.  3. The mitral valve is normal in structure. Mild mitral valve regurgitation. No evidence of mitral stenosis.  4. The aortic valve is calcified. There is severe calcifcation of the aortic valve. There is moderate thickening of the aortic valve. Aortic valve regurgitation is trivial. Severe aortic valve stenosis. Aortic valve area, by VTI measures 0.93 cm. Aortic valve mean gradient measures 39.3 mmHg. Aortic valve Vmax measures 4.10 m/s.  5. The inferior vena cava is normal in  size with greater than 50% respiratory variability, suggesting right atrial pressure of 3 mmHg.  Comparison(s): Prior images reviewed side by side. ECHO 2024 AV mean gradient 37 mmHg.  FINDINGS  Left Ventricle: Left ventricular ejection fraction, by estimation, is 65 to 70%. Left ventricular ejection fraction by 3D volume is 65 %. The left ventricle has normal function. The left ventricle has no regional wall motion abnormalities. The average left ventricular global longitudinal strain is -21.2 %. Strain was performed and the global longitudinal strain is normal. The left ventricular internal cavity size was normal in size. There is no left ventricular hypertrophy. Left ventricular diastolic parameters are consistent with Grade I diastolic dysfunction (impaired relaxation).  Right Ventricle: The right ventricular size is normal. No increase in right ventricular wall thickness. Right ventricular systolic function is normal. There is normal pulmonary artery systolic  pressure. The tricuspid regurgitant velocity is 2.41 m/s, and  with an assumed right atrial pressure of 3 mmHg, the estimated right ventricular systolic pressure is 26.2 mmHg.  Left Atrium: Left atrial size was normal in size.  Right Atrium: Right atrial size was normal in size.  Pericardium: There is no evidence of pericardial effusion.  Mitral Valve: The mitral valve is normal in structure. Mild mitral valve regurgitation. No evidence of mitral valve stenosis.  Tricuspid Valve: The tricuspid valve is normal in structure. Tricuspid valve regurgitation is not demonstrated. No evidence of tricuspid stenosis.  Aortic Valve: The aortic valve is calcified. There is severe calcifcation of the aortic valve. There is moderate thickening of the aortic valve. Aortic valve regurgitation is trivial. Severe aortic stenosis is present. Aortic valve mean gradient measures  39.3 mmHg. Aortic valve peak gradient measures 67.1  mmHg. Aortic valve area, by VTI measures 0.93 cm.  Pulmonic Valve: The pulmonic valve was normal in structure. Pulmonic valve regurgitation is mild. No evidence of pulmonic stenosis.  Aorta: The aortic root is normal in size and structure.  Venous: The inferior vena cava is normal in size with greater than 50% respiratory variability, suggesting right atrial pressure of 3 mmHg.  IAS/Shunts: No atrial level shunt detected by color flow Doppler.  Additional Comments: 3D was performed not requiring image post processing on an independent workstation and was normal.    LEFT VENTRICLE PLAX 2D LVIDd:         4.50 cm         Diastology LVIDs:         2.40 cm         LV e' medial:    6.66 cm/s LV PW:         1.00 cm         LV E/e' medial:  15.8 LV IVS:        1.00 cm         LV e' lateral:   6.92 cm/s LVOT diam:     2.00 cm         LV E/e' lateral: 15.2 LV SV:         88 LV SV Index:   51              2D Longitudinal LVOT Area:     3.14 cm        Strain                                2D Strain GLS   -20.8 %                                (A4C):                                2D Strain GLS   -21.9 %                                (A3C):                                2D Strain GLS   -20.8 %                                (  A2C):                                2D Strain GLS   -21.2 %                                Avg:                                  3D Volume EF                                LV 3D EF:    Left                                             ventricul                                             ar                                             ejection                                             fraction                                             by 3D                                             volume is                                             65 %.                                  3D Volume EF:                                3D EF:        65 %                                 LV EDV:       99 ml  LV ESV:       35 ml                                LV SV:        64 ml  RIGHT VENTRICLE             IVC RV Basal diam:  2.90 cm     IVC diam: 1.30 cm RV Mid diam:    2.80 cm RV S prime:     11.50 cm/s TAPSE (M-mode): 2.4 cm  LEFT ATRIUM           Index        RIGHT ATRIUM           Index LA diam:      3.70 cm 2.12 cm/m   RA Area:     10.20 cm LA Vol (A2C): 48.4 ml 27.71 ml/m  RA Volume:   19.40 ml  11.11 ml/m LA Vol (A4C): 36.2 ml 20.72 ml/m  AORTIC VALVE AV Area (Vmax):    0.86 cm AV Area (Vmean):   0.82 cm AV Area (VTI):     0.93 cm AV Vmax:           409.67 cm/s AV Vmean:          296.667 cm/s AV VTI:            0.954 m AV Peak Grad:      67.1 mmHg AV Mean Grad:      39.3 mmHg LVOT Vmax:         112.00 cm/s LVOT Vmean:        77.800 cm/s LVOT VTI:          0.281 m LVOT/AV VTI ratio: 0.29   AORTA Ao Root diam: 2.00 cm Ao Asc diam:  3.80 cm  MITRAL VALVE                TRICUSPID VALVE MV Area (PHT): 3.62 cm     TR Peak grad:   23.2 mmHg MV Decel Time: 210 msec     TR Vmax:        241.00 cm/s MV E velocity: 105.50 cm/s MV A velocity: 123.00 cm/s  SHUNTS MV E/A ratio:  0.86         Systemic VTI:  0.28 m                             Systemic Diam: 2.00 cm   Recent Labs: 10/13/2023: ALT 13    Wt Readings from Last 3 Encounters:  10/08/23 175 lb 12.8 oz (79.7 kg)  04/09/23 173 lb 9.6 oz (78.7 kg)  03/29/23 172 lb (78 kg)    Assessment and Plan:   1. Severe Aortic Valve Stenosis: She has severe, stage D aortic valve stenosis. She has NYHA class *** symptoms. I have personally reviewed the echo images. The aortic valve is thickened and calcified with limited leaflet mobility. I think she would benefit from AVR. She would be a candidate for surgical AVR or TAVR.    I have reviewed the natural history of aortic stenosis with the patient and their family members  who are present today. We have  discussed the limitations of medical therapy and the poor prognosis associated with symptomatic aortic stenosis. We have reviewed potential treatment options, including palliative medical therapy, conventional surgical aortic valve replacement,  and transcatheter aortic valve replacement. We discussed treatment options in the context of the patient's specific comorbid medical conditions.   She would like to proceed with planning for TAVR. I will arrange a right and left heart catheterization at [facility] ***. Risks and benefits of the cath procedure and the valve procedure are reviewed with the patient. After the cath, she will have a cardiac CT, CTA of the chest/abdomen and pelvis and will then be referred to see one of the CT surgeons on our TAVR team.     Labs/ tests ordered today include:  No orders of the defined types were placed in this encounter.  Disposition:   F/U will be arranged with the structural team  Signed, Lonni Cash, MD, [facility] 11/10/2023 4:19 PM    [facility] [address] [place], [place], [place]  [address] Phone: [phone]; Fax: [phone]

## 2023-11-11 ENCOUNTER — Ambulatory Visit: Attending: Cardiovascular Disease | Admitting: Cardiovascular Disease

## 2023-11-11 ENCOUNTER — Encounter: Payer: Self-pay | Admitting: Cardiovascular Disease

## 2023-11-11 VITALS — BP 130/86 | HR 74 | Ht 59.5 in | Wt 171.0 lb

## 2023-11-11 DIAGNOSIS — R931 Abnormal findings on diagnostic imaging of heart and coronary circulation: Secondary | ICD-10-CM | POA: Diagnosis not present

## 2023-11-11 DIAGNOSIS — I350 Nonrheumatic aortic (valve) stenosis: Secondary | ICD-10-CM | POA: Diagnosis not present

## 2023-11-11 NOTE — Patient Instructions (Addendum)
 Medication Instructions:  No changes *If you need a refill on your cardiac medications before your next appointment, please call your pharmacy*  Lab Work: none   Testing/Procedures: Your physician has requested that you have an echocardiogram. Echocardiography is a painless test that uses sound waves to create images of your heart. It provides your doctor with information about the size and shape of your heart and how well your heart's chambers and valves are working. This procedure takes approximately one hour. There are no restrictions for this procedure. Please do NOT wear cologne, perfume, aftershave, or lotions (deodorant is allowed). Please arrive 15 minutes prior to your appointment time.  Please note: We ask at that you not bring children with you during ultrasound (echo/ vascular) testing. Due to room size and safety concerns, children are not allowed in the ultrasound rooms during exams. Our front office staff cannot provide observation of children in our lobby area while testing is being conducted. An adult accompanying a patient to their appointment will only be allowed in the ultrasound room at the discretion of the ultrasound technician under special circumstances. We apologize for any inconvenience.   Follow-Up: At [facility], you and your health needs are our priority.  As part of our continuing mission to provide you with exceptional heart care, our providers are all part of one team.  This team includes your primary Cardiologist (physician) and Advanced Practice Providers or APPs (Physician Assistants and Nurse Practitioners) who all work together to provide you with the care you need, when you need it.  Your next appointment:   6 month(s)  Provider:   Lonni Cash, MD   - we will call you to schedule this appointment

## 2023-11-23 ENCOUNTER — Telehealth: Payer: Self-pay | Admitting: Pharmacist Clinician (PhC)/ Clinical Pharmacy Specialist

## 2023-11-23 ENCOUNTER — Telehealth: Payer: Self-pay

## 2023-11-23 ENCOUNTER — Other Ambulatory Visit (HOSPITAL_COMMUNITY): Payer: Self-pay

## 2023-11-23 ENCOUNTER — Ambulatory Visit: Attending: Internal Medicine | Admitting: Pharmacist Clinician (PhC)/ Clinical Pharmacy Specialist

## 2023-11-23 ENCOUNTER — Encounter: Payer: Self-pay | Admitting: Pharmacist Clinician (PhC)/ Clinical Pharmacy Specialist

## 2023-11-23 DIAGNOSIS — E7800 Pure hypercholesterolemia, unspecified: Secondary | ICD-10-CM

## 2023-11-23 NOTE — Telephone Encounter (Signed)
 Please do PA for Repatha

## 2023-11-23 NOTE — Progress Notes (Signed)
 Office Visit    Patient Name: Boby DELENA Pique Date of Encounter: 11/23/2023  Primary Care Provider:  Regino Slater, MD Primary Cardiologist:  Darryle ONEIDA Decent, MD  Chief Complaint    Hyperlipidemia - familial  Significant Past Medical History   CAD 04/28/22 CAC 130 (82nd percentile)  DM2 3/25 A1c 7.1 on glimepiride, metformin  HTN Mostly controlled, on irbesartan  Obesity BMI 33.96     Allergies  Allergen Reactions   Penicillins Rash    Confirmed with patient no SOB, lip/tongue swelling    History of Present Illness    Kinzleigh A Vasconez is a 68 y.o. female patient of Dr Decent, in the office today to discuss options for cholesterol management.  Ms Derossett had previously been in the Victorion 1 trial for inclisiran, but chose to leave the trial late last year.  She did not like that her MD was not aware of her cholesterol readings.  With her most recent cholesterol labs, Dr. Decent increased her rosuvastatin  from 20 to 40 mg daily, but this caused her to develop some myalgias, and the dose was reduced back to 20 mg daily.   She notes having had elevated cholesterol most of her adult life, her first medication to treat LDL was Questran powder.   Insurance Carrier: [employer] [id number] [phone]  [phone] deductible, then $47/month)  Pharmacy:   [facility]  Healthwell:   pt will double check income   LDL Cholesterol goal:  LDL < 70  Current Medications:   rosuvastatin  20 mg daily  Family Hx:   mother had CABG x 3 in her 77's, died at 74 multiple myeloma; dad no heart history; sister with AF; 2 children, both with high cholesterol, both on medication (both diagnosed pre-teen, younger DM)  Social Hx: Tobacco: former smoker Alcohol: occasional     Diet:    mostly home cooked, husband on feeding tube 2/2 throat cancer, so she mainly cooks for 1; lacking vegetables on a daily basis; protein is fish and chicken; some snacking  Accessory Clinical Findings   Lab Results   Component Value Date   CHOL 201 (H) 10/13/2023   HDL 54 10/13/2023   LDLCALC 134 (H) 10/13/2023   TRIG 73 10/13/2023   CHOLHDL 3.7 10/13/2023    Labs at [facility] 12/26/15  TC 331, G 103, HDL 56, LDL 254  No results found for: LIPOA  Lab Results  Component Value Date   ALT 13 10/13/2023   AST 18 10/13/2023   ALKPHOS 81 10/13/2023   BILITOT 0.3 10/13/2023   Lab Results  Component Value Date   CREATININE 1.23 (H) 07/03/2015   BUN 11 07/03/2015   NA 140 07/03/2015   K 4.0 07/03/2015   CL 112 (H) 07/03/2015   CO2 23 07/03/2015   Lab Results  Component Value Date   HGBA1C 7.5 (H) 06/30/2015    Home Medications    Current Outpatient Medications  Medication Sig Dispense Refill   cholecalciferol (VITAMIN D3) 25 MCG (1000 UNIT) tablet Take 2,000 Units by mouth daily.     glimepiride (AMARYL) 1 MG tablet Take 0.5 mg by mouth every morning.     irbesartan (AVAPRO) 150 MG tablet Take 150 mg by mouth daily.     metFORMIN (GLUCOPHAGE) 1000 MG tablet Take 1,000 mg by mouth 2 (two) times daily with a meal.     rosuvastatin  (CRESTOR ) 20 MG tablet Take 1 tablet (20 mg total) by mouth at bedtime. 90 tablet 3  Current Facility-Administered Medications  Medication Dose Route Frequency Provider Last Rate Last Admin   Study - VICTORION-1 PREVENT - inclisiran 300 mg/1.5mL or placebo SQ injection (PI-Stuckey)  300 mg Subcutaneous Q6 months    300 mg at 03/29/23 0831     Assessment & Plan    High cholesterol Assessment: Patient with ASCVD and familial hyperlipidemia not at LDL goal of < 70 Most recent LDL 134 on 10/13/23 Per chart labs at [facility] 12/26/2015 had LDL of 254 Has been compliant with high intensity statin : rosuvastatin  20 mg daily  Reviewed options for lowering LDL cholesterol, including PCSK-9 inhibitors, bempedoic acid and inclisiran.  Discussed mechanisms of action, dosing, side effects, potential decreases in LDL cholesterol and costs.  Also reviewed potential options for  patient assistance. Ezetimibe would be unlikely to get to LDL goal, as she needs 48% decrease Plan: Patient agreeable to starting Repatha 140 mg q14d Repeat labs after:  3 months Lipid Liver function Patient was given information on [employer] grant - will sign patient up when income confimed Marital status Income < $72,000 (single) or < $102,000 (married)   Allean Mink, PharmD CPP [facility] [address]   [place], [place] [address] [phone]  11/23/2023, 12:34 PM

## 2023-11-23 NOTE — Telephone Encounter (Signed)
 Pharmacy Patient Advocate Encounter   Received notification from Physician's Office that prior authorization for REPATHA is required/requested.   Insurance verification completed.   The patient is insured through [facility] .   Per test claim: PA required; PA submitted to above mentioned insurance via CoverMyMeds Key/confirmation #/EOC [facility] Status is pending

## 2023-11-23 NOTE — Patient Instructions (Signed)
 Your Results:             Your most recent labs Goal  Total Cholesterol 201 < 200  Triglycerides 73 < 150  HDL (happy/good cholesterol) 54 > 40  LDL (lousy/bad cholesterol 134 < 70   Medication changes:  We will start the process to get Repatha covered by your insurance.  Once the prior authorization is complete, I will send a MyChart message to let you know and confirm pharmacy information.   You will take one dose every 14 days.    Lab orders:  We want to repeat labs after 2-3 months.  We will send you a lab order to remind you once we get closer to that time.    Patient Assistance:    We will sign you up for a [employer]well Grant once your medication is approved by [employer].  I will call you with the ID number, then you will take this information to the pharmacy.  They will bill it after your insurance, bringing your copay to $0.  The grant will pay the first $2,500 in a one year period.    ID   BIN [id number]  PCN [id number]  GRP [id number]    Thank you for choosing [facility]

## 2023-11-23 NOTE — Telephone Encounter (Signed)
 Pharmacy Patient Advocate Encounter  Received notification from [facility] that Prior Authorization for REPATHA has been APPROVED from 11/23/23 to 05/25/24. Ran test claim, Copay is $467. This test claim was processed through [facility]- copay amounts may vary at other pharmacies due to pharmacy/plan contracts, or as the patient moves through the different stages of their insurance plan.    DEDUCTIBLE TO MEET, $420 OF PT COST WENT TO DEDUCTIBLE. BILLING DETAILS NOTE THAT PT WILL HAVE A COPAY OF $47 ONCE DEDUCTIBLE HAS BEEN MET

## 2023-11-23 NOTE — Telephone Encounter (Signed)
 PA request has been Submitted. New Encounter has been or will be created for follow up. For additional info see Pharmacy Prior Auth telephone encounter from 11/23/23.

## 2023-11-23 NOTE — Assessment & Plan Note (Addendum)
 Assessment: Patient with ASCVD and familial hyperlipidemia not at LDL goal of < 70 Most recent LDL 134 on 10/13/23 Per chart labs at [facility] 12/26/2015 had LDL of 254 Has been compliant with high intensity statin : rosuvastatin  20 mg daily  Reviewed options for lowering LDL cholesterol, including PCSK-9 inhibitors, bempedoic acid and inclisiran.  Discussed mechanisms of action, dosing, side effects, potential decreases in LDL cholesterol and costs.  Also reviewed potential options for patient assistance. Ezetimibe would be unlikely to get to LDL goal, as she needs 48% decrease Plan: Patient agreeable to starting Repatha 140 mg q14d Repeat labs after:  3 months Lipid Liver function Patient was given information on [employer] - will sign patient up when income confimed Marital status Income < $72,000 (single) or < $102,000 (married)

## 2023-11-24 ENCOUNTER — Encounter: Payer: Self-pay | Admitting: Pharmacist Clinician (PhC)/ Clinical Pharmacy Specialist

## 2023-11-26 ENCOUNTER — Other Ambulatory Visit (HOSPITAL_COMMUNITY): Payer: Self-pay

## 2023-11-26 ENCOUNTER — Telehealth: Payer: Self-pay | Admitting: Pharmacy Technician

## 2023-11-26 MED ORDER — REPATHA SURECLICK 140 MG/ML ~~LOC~~ SOAJ: 140.0000 mg | SUBCUTANEOUS | 3 refills | Status: AC

## 2023-11-26 NOTE — Telephone Encounter (Signed)
 [employer]Well ID: [id number]  Patient Advocate Encounter   The patient was approved for a [employer] grant that will help cover the cost of repatha Total amount awarded, 2500.  Effective: 10/27/23 - 10/25/24   APW:[id number] ERW:[id number] Hmnle:[id number] PI:[id number] Healthwell ID: [id number]   Pharmacy provided with approval and processing information. Patient informed via mychart

## 2023-11-26 NOTE — Addendum Note (Signed)
 Addended by: D'Arcy Abraha L on: 11/26/2023 02:22 PM   Modules accepted: Orders

## 2023-11-26 NOTE — Telephone Encounter (Signed)
 [facility] information about [facility]

## 2024-01-14 ENCOUNTER — Ambulatory Visit: Admitting: Cardiology

## 2024-01-20 DIAGNOSIS — B351 Tinea unguium: Secondary | ICD-10-CM | POA: Diagnosis not present

## 2024-01-20 DIAGNOSIS — E119 Type 2 diabetes mellitus without complications: Secondary | ICD-10-CM | POA: Diagnosis not present

## 2024-01-20 DIAGNOSIS — I350 Nonrheumatic aortic (valve) stenosis: Secondary | ICD-10-CM | POA: Diagnosis not present

## 2024-01-20 DIAGNOSIS — Z79899 Other long term (current) drug therapy: Secondary | ICD-10-CM | POA: Diagnosis not present

## 2024-01-20 DIAGNOSIS — E7800 Pure hypercholesterolemia, unspecified: Secondary | ICD-10-CM | POA: Diagnosis not present

## 2024-01-20 LAB — LAB REPORT - SCANNED
A1c: 6.9
EGFR: 90

## 2024-02-29 ENCOUNTER — Ambulatory Visit: Attending: Family Medicine | Admitting: Physical Therapy

## 2024-02-29 ENCOUNTER — Encounter: Payer: Self-pay | Admitting: Physical Therapy

## 2024-02-29 ENCOUNTER — Other Ambulatory Visit: Payer: Self-pay

## 2024-02-29 DIAGNOSIS — M6281 Muscle weakness (generalized): Secondary | ICD-10-CM | POA: Insufficient documentation

## 2024-02-29 DIAGNOSIS — M25511 Pain in right shoulder: Secondary | ICD-10-CM | POA: Insufficient documentation

## 2024-02-29 DIAGNOSIS — M25512 Pain in left shoulder: Secondary | ICD-10-CM | POA: Diagnosis present

## 2024-02-29 DIAGNOSIS — M25611 Stiffness of right shoulder, not elsewhere classified: Secondary | ICD-10-CM | POA: Insufficient documentation

## 2024-02-29 NOTE — Therapy (Signed)
 OUTPATIENT PHYSICAL THERAPY SHOULDER EVALUATION   Patient Name: [name] MRN: [id number] DOB:1956/01/07, 68 y.o., female Today's Date: 02/29/2024  END OF SESSION:  PT End of Session - 02/29/24 0930     Visit Number 1    Date for Recertification  04/25/24    Authorization Type [employer] Medicare will submit auth; UHC questionaire too    Progress Note Due on Visit 10    PT Start Time 0930    PT Stop Time 1014    PT Time Calculation (min) 44 min    Activity Tolerance Patient tolerated treatment well          Past Medical History:  Diagnosis Date   Aortic stenosis    Diabetes mellitus without complication (HCC) 2010   Diverticulitis 06/2015   High cholesterol 1987   Hypertension 2017   Obesity    Ureteral stone 06/2015   Past Surgical History:  Procedure Laterality Date   BREAST BIOPSY Left 11/10/2005   benign   CESAREAN SECTION  1978   1982 second   LAPAROSCOPIC HYSTERECTOMY Bilateral 2008   TONSILECTOMY, ADENOIDECTOMY, BILATERAL MYRINGOTOMY AND TUBES  1976   Patient Active Problem List   Diagnosis Date Noted   Abdominal pain 06/30/2015   Diverticulitis 06/30/2015   Ureteral stone 06/30/2015   Hydronephrosis 06/30/2015   Lower urinary tract infectious disease 06/30/2015   Acute kidney injury 06/30/2015   Diabetes mellitus without complication (HCC)    High cholesterol    Obesity    Acute diverticulitis     PCP: Regino Dibas MD  REFERRING PROVIDER: Regino Dibas MD  REFERRING DIAG: M25.511 right shoulder pain  THERAPY DIAG:  Right shoulder pain; right shoulder stiffness; weakness; left shoulder pain  Rationale for Evaluation and Treatment: Rehabilitation  ONSET DATE: last few months  SUBJECTIVE:                                                                                                                                                                                      SUBJECTIVE STATEMENT: I went to the doctor's b/c I had right breast  pain.  He wasn't happy with my lack of motion right shoulder. No shoulder pain but rather upper arm pain and difficulty moving her arm. Left arm as well as right arm.   I think this happened 1 year ago bc of cholesterol medicine. Has been helping with husband's feeding tube but I don't think that caused this (throat cancer)   Has 2 Lab dogs Has home TENS Sews Has home gym with Bowflex, kettlebells, foam roll Hand dominance: Right  PERTINENT HISTORY: DM; HTN; aortic valve stenosis (being watched); right knee OA (scoped)  PAIN:   Are you having pain? Yes NPRS scale: 5/10 Pain location: right upper arm; left upper arm Pain orientation: Bilateral  PAIN TYPE: aching Pain description: intermittent  Aggravating factors: right sidelying with arm bent; leaning on elbow, feel on left side with petting the dogs prolonged abduction; feel it with taking off a pullover shirt Relieving factors: no pain at rest; doesn't hurt to carry things or vacumn PRECAUTIONS: None  WEIGHT BEARING RESTRICTIONS: No  FALLS:  Has patient fallen in last 6 months? No  PLOF: Independent  PATIENT GOALS:find out why this happening  NEXT MD VISIT:   OBJECTIVE:  Note: Objective measures were completed at Evaluation unless otherwise noted.  DIAGNOSTIC FINDINGS:  none  PATIENT SURVEYS:  Quick Dash:  QUICK DASH  Please rate your ability do the following activities in the last week by selecting the number below the appropriate response.   Activities Rating  Open a tight or new jar.  3 = Moderate difficulty  Do heavy household chores (e.g., wash walls, floors). 1 = No difficulty   Carry a shopping bag or briefcase 1 = No difficulty   Wash your back. 1 = No difficulty   Use a knife to cut food. 1 = No difficulty   Recreational activities in which you take some force or impact through your arm, shoulder or hand (e.g., golf, hammering, tennis, etc.). 1 = No difficulty   During the past week, to what extent has  your arm, shoulder or hand problem interfered with your normal social activities with family, friends, neighbors or groups?  1 = Not at all  During the past week, were you limited in your work or other regular daily activities as a result of your arm, shoulder or hand problem? 1 = Not limited at all  Rate the severity of the following symptoms in the last week: Arm, Shoulder, or hand pain. 1 = none  Rate the severity of the following symptoms in the last week: Tingling (pins and needles) in your arm, shoulder or hand. 1 = none  During the past week, how much difficulty have you had sleeping because of the pain in your arm, shoulder or hand?  3 = Moderate difficulty   (A QuickDASH score may not be calculated if there is greater than 1 missing item.)  Quick Dash Disability/Symptom Score: 9.1%  Minimally Clinically Important Difference (MCID): 15-20 points  Flavio, F. et al. (2013). Minimally clinically important difference of the disabilities of the arm, shoulder, and hand outcome measures (DASH) and its shortened version (Quick DASH). Journal of Orthopaedic & Sports Physical Therapy, 44(1), 30-39)   COGNITION: Overall cognitive status: Within functional limits for tasks assessed     POSTURE: Forward shoulders  UPPER EXTREMITY ROM:   Active ROM Right eval Left eval  Shoulder flexion 143 pulls upper arm 149  Shoulder extension    Shoulder abduction 128 mild pull 148  Shoulder adduction    Shoulder internal rotation L1 sore T8  Shoulder external rotation 77 sore/C7 82/T1  Elbow flexion    Elbow extension    Wrist flexion    Wrist extension    Wrist ulnar deviation    Wrist radial deviation    Wrist pronation    Wrist supination    (Blank rows = not tested)  UPPER EXTREMITY MMT:  MMT Right eval Left eval  Shoulder flexion 4 4+  Shoulder extension    Shoulder abduction 4 4+  Shoulder adduction    Shoulder internal rotation  4 4+  Shoulder external rotation 4 4+   Middle trapezius 4- 4  Lower trapezius 4- 4  Elbow flexion    Elbow extension    Wrist flexion    Wrist extension    Wrist ulnar deviation    Wrist radial deviation    Wrist pronation    Wrist supination    Grip strength (lbs)    (Blank rows = not tested)  SHOULDER SPECIAL TESTS: Negative drop arm; negative Vonzell Berber, negative active compression test   JOINT MOBILITY TESTING:  Right > left glenohumeral hypomobility in all directions; decreased thoracic extension mobility  PALPATION:  Decreased bil pectoral lengths                                                                                                                             TREATMENT DATE: 02/29/24 Evaluation Discussed risk factors for adhesive capsulitis: gender, diabetes, age Initial HEP as below   PATIENT EDUCATION: Education details: Educated patient on anatomy and physiology of current symptoms, prognosis, plan of care as well as initial self care strategies to promote recovery Person educated: Patient Education method: Explanation Education comprehension: verbalized understanding  HOME EXERCISE PROGRAM: Access Code: [email or web address] URL: [email or web address] Date: 02/29/2024 Prepared by: Glade Pesa  Exercises - Supine Shoulder Flexion Overhead with Dowel (Mirrored)  - 1 x daily - 7 x weekly - 1 sets - 3 reps - 30 hold - Seated Shoulder Flexion Slide at Table Top with Forearm in Neutral  - 1 x daily - 7 x weekly - 1 sets - 3 reps - 30 hold - Standing Single Shoulder Flexion Wall Slide with Palm Up  - 1 x daily - 7 x weekly - 3 sets - 3 reps - 30 hold - Seated Scapular Retraction  - 1 x daily - 7 x weekly - 1 sets - 10 reps - Seated Thoracic Lumbar Extension with Pectoralis Stretch  - 1 x daily - 7 x weekly - 1 sets - 10 reps  ASSESSMENT:  CLINICAL IMPRESSION: Patient is a 68 y.o. female who was seen today for physical therapy evaluation and treatment for right and left upper arm  pain and stiffness.  The patient would benefit from skilled PT to address shoulder range of motion limitations particularly shoulder elevation and internal rotation as well as glenohumeral, scapular and thoracic joint hypomobility.  She would benefit from establishment of a HEP particularly to address ROM given she has risk factors for developing adhesive capsulitis. The patient would also benefit from strengthening to correct asymmetries including weakness in rotator cuff and periscapular musculature.   OBJECTIVE IMPAIRMENTS: decreased activity tolerance, decreased ROM, decreased strength, hypomobility, impaired perceived functional ability, and pain.   ACTIVITY LIMITATIONS: sleeping, dressing, and caring for others  PARTICIPATION LIMITATIONS: taking care of dogs  PERSONAL FACTORS: Age and 1 comorbidity: diabetes are also affecting patient's functional outcome.   REHAB POTENTIAL: Good  CLINICAL DECISION MAKING: Stable/uncomplicated  EVALUATION COMPLEXITY: Low  GOALS: Goals reviewed with patient? Yes  SHORT TERM GOALS: Target date: 03/28/2024    The patient will demonstrate knowledge of basic self care strategies and exercises to promote healing  Baseline: Goal status: INITIAL  2.  The patient will have improved shoulder elevation ROM to at least 147 degrees needed for grooming/dressing purposes as well as reaching high shelves  Baseline:  Goal status: INITIAL  3.  Shoulder internal rotation to T10 needed for dressing tasks Baseline:  Goal status: INITIAL    LONG TERM GOALS: Target date: 04/25/2024    The patient will be independent in a safe self progression of a home exercise program to promote further recovery of function  Baseline:  Goal status: INITIAL  2.  The patient will report a 60% improvement in pain levels with functional activities which are currently difficult including sleeping on her sides, petting the dogs, taking on/off a pullover shirt Baseline:  Goal  status: INITIAL  3.  The patient will have improved shoulder elevation ROM to at least 153 degrees needed for grooming/dressing purposes as well as reaching high shelves  Baseline:  Goal status: INITIAL  4.  Shoulder internal rotation to 70 degrees needed for dressing tasks Baseline:  Goal status: INITIAL                   PLAN:  PT FREQUENCY: 1x/week  PT DURATION: 8 weeks  PLANNED INTERVENTIONS: 97164- PT Re-evaluation, 97750- Physical Performance Testing, 97110-Therapeutic exercises, 97530- Therapeutic activity, 97112- Neuromuscular re-education, 97535- Self Care, 02859- Manual therapy, [phone]- Aquatic Therapy, H9716- Electrical stimulation (unattended), [phone]- Electrical stimulation (manual), [id number]- Ultrasound, [id number]- Ionotophoresis 4mg /ml Dexamethasone, 79439 (1-2 muscles), 20561 (3+ muscles)- Dry Needling, Patient/Family education, Taping, Joint mobilization, Cryotherapy, and Moist heat  PLAN FOR NEXT SESSION: review HEP and progress; add internal rotation ROM and strengthening;  right glenohumeral and scapular joint mobs  Glade Pesa, PT 02/29/24 11:21 AM Phone: [phone] Fax: [phone]

## 2024-03-07 NOTE — Therapy (Signed)
 OUTPATIENT PHYSICAL THERAPY SHOULDER EVALUATION   Patient Name: [name] MRN: [id number] DOB:June 22, 1955, 68 y.o., female Today's Date: 03/08/2024  END OF SESSION:  PT End of Session - 03/08/24 0932     Visit Number 2    Date for Recertification  04/25/24    Authorization Type U[employer] Medicare will submit auth; UHC questionaire too    Progress Note Due on Visit 10    PT Start Time 0932    PT Stop Time 1011    PT Time Calculation (min) 39 min    Activity Tolerance Patient tolerated treatment well    Behavior During Therapy WFL for tasks assessed/performed           Past Medical History:  Diagnosis Date   Aortic stenosis    Diabetes mellitus without complication (HCC) 2010   Diverticulitis 06/2015   High cholesterol 1987   Hypertension 2017   Obesity    Ureteral stone 06/2015   Past Surgical History:  Procedure Laterality Date   BREAST BIOPSY Left 11/10/2005   benign   CESAREAN SECTION  1978   1982 second   LAPAROSCOPIC HYSTERECTOMY Bilateral 2008   TONSILECTOMY, ADENOIDECTOMY, BILATERAL MYRINGOTOMY AND TUBES  1976   Patient Active Problem List   Diagnosis Date Noted   Abdominal pain 06/30/2015   Diverticulitis 06/30/2015   Ureteral stone 06/30/2015   Hydronephrosis 06/30/2015   Lower urinary tract infectious disease 06/30/2015   Acute kidney injury 06/30/2015   Diabetes mellitus without complication (HCC)    High cholesterol    Obesity    Acute diverticulitis     PCP: Regino, Dibas MD  REFERRING PROVIDER: Regino Dibas MD  REFERRING DIAG: M25.511 right shoulder pain  THERAPY DIAG:  Right shoulder pain; right shoulder stiffness; weakness; left shoulder pain  Rationale for Evaluation and Treatment: Rehabilitation  ONSET DATE: last few months  SUBJECTIVE:                                                                                                                                                                                       SUBJECTIVE STATEMENT: The left shoulder throbs, the right is just tight. My left arm hurts after I've been sitting with it extended outward on the dog.  Eval: I went to the doctor's b/c I had right breast pain.  He wasn't happy with my lack of motion right shoulder. No shoulder pain but rather upper arm pain and difficulty moving her arm. Left arm as well as right arm.   I think this happened 1 year ago bc of cholesterol medicine. Has been helping with husband's feeding tube but I don't think that caused this (  throat cancer)   Has 2 Lab dogs Has home TENS Sews Has home gym with Bowflex, kettlebells, foam roll Hand dominance: Right  PERTINENT HISTORY: DM; HTN; aortic valve stenosis (being watched); right knee OA (scoped)  PAIN:   Are you having pain? Yes NPRS scale: 2/10 Pain location:  left upper arm Pain orientation: Bilateral  PAIN TYPE: aching Pain description: intermittent  Aggravating factors: right sidelying with arm bent; leaning on elbow, feel on left side with petting the dogs prolonged abduction; feel it with taking off a pullover shirt Relieving factors: no pain at rest; doesn't hurt to carry things or vacumn PRECAUTIONS: None  WEIGHT BEARING RESTRICTIONS: No  FALLS:  Has patient fallen in last 6 months? No  PLOF: Independent  PATIENT GOALS:find out why this happening  NEXT MD VISIT:   OBJECTIVE:  Note: Objective measures were completed at Evaluation unless otherwise noted.  DIAGNOSTIC FINDINGS:  none  PATIENT SURVEYS:  Quick Dash:  QUICK DASH  Please rate your ability do the following activities in the last week by selecting the number below the appropriate response.   Activities Rating  Open a tight or new jar.  3 = Moderate difficulty  Do heavy household chores (e.g., wash walls, floors). 1 = No difficulty   Carry a shopping bag or briefcase 1 = No difficulty   Wash your back. 1 = No difficulty   Use a knife to cut food. 1 = No difficulty    Recreational activities in which you take some force or impact through your arm, shoulder or hand (e.g., golf, hammering, tennis, etc.). 1 = No difficulty   During the past week, to what extent has your arm, shoulder or hand problem interfered with your normal social activities with family, friends, neighbors or groups?  1 = Not at all  During the past week, were you limited in your work or other regular daily activities as a result of your arm, shoulder or hand problem? 1 = Not limited at all  Rate the severity of the following symptoms in the last week: Arm, Shoulder, or hand pain. 1 = none  Rate the severity of the following symptoms in the last week: Tingling (pins and needles) in your arm, shoulder or hand. 1 = none  During the past week, how much difficulty have you had sleeping because of the pain in your arm, shoulder or hand?  3 = Moderate difficulty   (A QuickDASH score may not be calculated if there is greater than 1 missing item.)  Quick Dash Disability/Symptom Score: 9.1%  Minimally Clinically Important Difference (MCID): 15-20 points  Flavio, F. et al. (2013). Minimally clinically important difference of the disabilities of the arm, shoulder, and hand outcome measures (DASH) and its shortened version (Quick DASH). Journal of Orthopaedic & Sports Physical Therapy, 44(1), 30-39)   COGNITION: Overall cognitive status: Within functional limits for tasks assessed     POSTURE: Forward shoulders  UPPER EXTREMITY ROM:   Active ROM Right eval Left eval  Shoulder flexion 143 pulls upper arm 149  Shoulder extension    Shoulder abduction 128 mild pull 148  Shoulder adduction    Shoulder internal rotation L1 sore T8  Shoulder external rotation 77 sore/C7 82/T1  Elbow flexion    Elbow extension    Wrist flexion    Wrist extension    Wrist ulnar deviation    Wrist radial deviation    Wrist pronation    Wrist supination    (Blank rows =  not tested)  UPPER EXTREMITY  MMT:  MMT Right eval Left eval  Shoulder flexion 4 4+  Shoulder extension    Shoulder abduction 4 4+  Shoulder adduction    Shoulder internal rotation 4 4+  Shoulder external rotation 4 4+  Middle trapezius 4- 4  Lower trapezius 4- 4  Elbow flexion    Elbow extension    Wrist flexion    Wrist extension    Wrist ulnar deviation    Wrist radial deviation    Wrist pronation    Wrist supination    Grip strength (lbs)    (Blank rows = not tested)  SHOULDER SPECIAL TESTS: Negative drop arm; negative Vonzell Berber, negative active compression test   JOINT MOBILITY TESTING:  Right > left glenohumeral hypomobility in all directions; decreased thoracic extension mobility  PALPATION:  Decreased bil pectoral lengths                                                                                                                             TREATMENT DATE:  03/08/24 Pulleys flex x 2 min,  IR x 1 min (not much stretch) Reviewed: Standing wall slide, Seated scap retraction, seated thoracic ext with pec stretch 3 way raises with 2# wts 1x10 Standing B ER with green band 2x10 Row with blue band x 20 Shoulder ext blue band x 20 Horizontal ABD green x 20 Lat pull 30# x 20 IR green x20 in door ER green x 20 in door 3 way reach with blue loop x 5 B   02/29/24 Evaluation Discussed risk factors for adhesive capsulitis: gender, diabetes, age Initial HEP as below   PATIENT EDUCATION: Education details: Educated patient on anatomy and physiology of current symptoms, prognosis, plan of care as well as initial self care strategies to promote recovery Person educated: Patient Education method: Explanation Education comprehension: verbalized understanding  HOME EXERCISE PROGRAM: Access Code: [email or web address] URL: [email or web address] Date: 03/08/2024 Prepared by: Mliss  Exercises - Supine Shoulder Flexion Overhead with Dowel (Mirrored)  - 1 x daily - 7 x weekly - 1 sets -  3 reps - 30 hold - Seated Shoulder Flexion Slide at Table Top with Forearm in Neutral  - 1 x daily - 7 x weekly - 1 sets - 3 reps - 30 hold - Standing Single Shoulder Flexion Wall Slide with Palm Up  - 1 x daily - 7 x weekly - 3 sets - 3 reps - 30 hold - Seated Scapular Retraction  - 1 x daily - 7 x weekly - 1 sets - 10 reps - Seated Thoracic Lumbar Extension with Pectoralis Stretch  - 1 x daily - 7 x weekly - 1 sets - 10 reps - Shoulder External Rotation with Anchored Resistance  - 1 x daily - 3 x weekly - 1-3 sets - 10 reps - Shoulder Internal Rotation with Resistance  - 1 x daily - 3 x weekly - 1-3 sets - 10 reps -  Standing Shoulder Row with Anchored Resistance  - 1 x daily - 3 x weekly - 1-3 sets - 10 reps - Shoulder extension with resistance - Neutral  - 1 x daily - 3 x weekly - 2 sets - 10 reps - Standing Shoulder Horizontal Abduction with Resistance  - 1 x daily - 3 x weekly - 2 sets - 10 reps - Standing Plank on Wall with Reaches and Resistance  - 1 x daily - 3 x weekly - 2 sets - 10 reps - Standing Shoulder Flexion to 90 Degrees  - 1 x daily - 3 x weekly - 1-3 sets - 10 reps - 3 sec hold - Standing Shoulder Scaption  - 1 x daily - 3 x weekly - 1-3 sets - 10 reps - 3 sec hold - Shoulder Abduction - Thumbs Up  - 1 x daily - 3 x weekly - 1-3 sets - 10 reps - 3 sec hold  ASSESSMENT:  CLINICAL IMPRESSION: Patient responded well to stretching at home. Pulleys gave some stretch in flexion, but IR is WNL. We focused on strengthening her shoulders and postural muscles. Left side is weaker primarily in ER and with 3 way reaches. HEP updated. Patient has many bands and a BowFlex at home.   Eval: Patient is a 68 y.o. female who was seen today for physical therapy evaluation and treatment for right and left upper arm pain and stiffness.  The patient would benefit from skilled PT to address shoulder range of motion limitations particularly shoulder elevation and internal rotation as well as  glenohumeral, scapular and thoracic joint hypomobility.  She would benefit from establishment of a HEP particularly to address ROM given she has risk factors for developing adhesive capsulitis. The patient would also benefit from strengthening to correct asymmetries including weakness in rotator cuff and periscapular musculature.   OBJECTIVE IMPAIRMENTS: decreased activity tolerance, decreased ROM, decreased strength, hypomobility, impaired perceived functional ability, and pain.   ACTIVITY LIMITATIONS: sleeping, dressing, and caring for others  PARTICIPATION LIMITATIONS: taking care of dogs  PERSONAL FACTORS: Age and 1 comorbidity: diabetes are also affecting patient's functional outcome.   REHAB POTENTIAL: Good  CLINICAL DECISION MAKING: Stable/uncomplicated  EVALUATION COMPLEXITY: Low   GOALS: Goals reviewed with patient? Yes  SHORT TERM GOALS: Target date: 03/28/2024    The patient will demonstrate knowledge of basic self care strategies and exercises to promote healing  Baseline: Goal status: INITIAL  2.  The patient will have improved shoulder elevation ROM to at least 147 degrees needed for grooming/dressing purposes as well as reaching high shelves  Baseline:  Goal status: INITIAL  3.  Shoulder internal rotation to T10 needed for dressing tasks Baseline:  Goal status: INITIAL    LONG TERM GOALS: Target date: 04/25/2024    The patient will be independent in a safe self progression of a home exercise program to promote further recovery of function  Baseline:  Goal status: INITIAL  2.  The patient will report a 60% improvement in pain levels with functional activities which are currently difficult including sleeping on her sides, petting the dogs, taking on/off a pullover shirt Baseline:  Goal status: INITIAL  3.  The patient will have improved shoulder elevation ROM to at least 153 degrees needed for grooming/dressing purposes as well as reaching high shelves   Baseline:  Goal status: INITIAL  4.  Shoulder internal rotation to 70 degrees needed for dressing tasks Baseline:  Goal status: INITIAL  PLAN:  PT FREQUENCY: 1x/week  PT DURATION: 8 weeks  PLANNED INTERVENTIONS: 97164- PT Re-evaluation, 97750- Physical Performance Testing, 97110-Therapeutic exercises, 97530- Therapeutic activity, 97112- Neuromuscular re-education, 97535- Self Care, 02859- Manual therapy, [phone]- Aquatic Therapy, [phone]- Electrical stimulation (unattended), [phone]- Electrical stimulation (manual), [id number]- Ultrasound, [id number]- Ionotophoresis 4mg /ml Dexamethasone, 79439 (1-2 muscles), 20561 (3+ muscles)- Dry Needling, Patient/Family education, Taping, Joint mobilization, Cryotherapy, and Moist heat  PLAN FOR NEXT SESSION: review HEP and progress; continue strengthening;  right glenohumeral and scapular joint mobs  Mliss Cummins, PT  03/08/24 10:16 AM Phone: [phone] Fax: [phone]

## 2024-03-08 ENCOUNTER — Encounter: Payer: Self-pay | Admitting: Physical Therapy

## 2024-03-08 ENCOUNTER — Ambulatory Visit: Admitting: Physical Therapy

## 2024-03-08 DIAGNOSIS — M25512 Pain in left shoulder: Secondary | ICD-10-CM

## 2024-03-08 DIAGNOSIS — M25511 Pain in right shoulder: Secondary | ICD-10-CM | POA: Diagnosis not present

## 2024-03-08 DIAGNOSIS — M25611 Stiffness of right shoulder, not elsewhere classified: Secondary | ICD-10-CM

## 2024-03-08 DIAGNOSIS — M6281 Muscle weakness (generalized): Secondary | ICD-10-CM

## 2024-03-15 ENCOUNTER — Encounter: Payer: Self-pay | Admitting: Physical Therapy

## 2024-03-15 ENCOUNTER — Ambulatory Visit: Admitting: Physical Therapy

## 2024-03-15 DIAGNOSIS — M25611 Stiffness of right shoulder, not elsewhere classified: Secondary | ICD-10-CM

## 2024-03-15 DIAGNOSIS — M25511 Pain in right shoulder: Secondary | ICD-10-CM | POA: Diagnosis not present

## 2024-03-15 DIAGNOSIS — M25512 Pain in left shoulder: Secondary | ICD-10-CM

## 2024-03-15 DIAGNOSIS — M6281 Muscle weakness (generalized): Secondary | ICD-10-CM

## 2024-03-15 NOTE — Therapy (Signed)
 OUTPATIENT PHYSICAL THERAPY SHOULDER PROGRESS NOTE   Patient Name: [name] MRN: [id number] DOB:[birth year], 68 y.o., female Today's Date: 03/15/2024  END OF SESSION:  PT End of Session - 03/15/24 1229     Visit Number 3    Date for Recertification  04/25/24    Authorization Type [employer] Medicare 8 visits 11/4-12/30; [employer] questionaire too    Authorization - Visit Number 3    Authorization - Number of Visits 8    Progress Note Due on Visit 10    PT Start Time 1230    PT Stop Time 1315    PT Time Calculation (min) 45 min    Activity Tolerance Patient tolerated treatment well           Past Medical History:  Diagnosis Date   Aortic stenosis    Diabetes mellitus without complication (HCC) 2010   Diverticulitis 06/2015   High cholesterol 1987   Hypertension 2017   Obesity    Ureteral stone 06/2015   Past Surgical History:  Procedure Laterality Date   BREAST BIOPSY Left 11/10/2005   benign   CESAREAN SECTION  1978   1982 second   LAPAROSCOPIC HYSTERECTOMY Bilateral 2008   TONSILECTOMY, ADENOIDECTOMY, BILATERAL MYRINGOTOMY AND TUBES  1976   Patient Active Problem List   Diagnosis Date Noted   Abdominal pain 06/30/2015   Diverticulitis 06/30/2015   Ureteral stone 06/30/2015   Hydronephrosis 06/30/2015   Lower urinary tract infectious disease 06/30/2015   Acute kidney injury 06/30/2015   Diabetes mellitus without complication (HCC)    High cholesterol    Obesity    Acute diverticulitis     PCP: Regino Dibas MD  REFERRING PROVIDER: Regino Dibas MD  REFERRING DIAG: M25.511 right shoulder pain  THERAPY DIAG:  Right shoulder pain; right shoulder stiffness; weakness; left shoulder pain  Rationale for Evaluation and Treatment: Rehabilitation  ONSET DATE: last few months  SUBJECTIVE:                                                                                                                                                                                       SUBJECTIVE STATEMENT: It's my knee today.  Had some soreness in shoulders after last visit 5-6/10.  It was OK.  I like the [employer] videos.  I'd like to add the ex's with the weights.   Eval: I went to the doctor's b/c I had right breast pain.  He wasn't happy with my lack of motion right shoulder. No shoulder pain but rather upper arm pain and difficulty moving her arm. Left arm as well as right arm.   I think this happened 1 year  ago bc of cholesterol medicine. Has been helping with husband's feeding tube but I don't think that caused this (throat cancer)   Has 2 Lab dogs Has home TENS Sews Has home gym with Bowflex, kettlebells, foam roll Hand dominance: Right  PERTINENT HISTORY: DM; HTN; aortic valve stenosis (being watched); right knee OA (scoped)  PAIN:   Are you having pain? Yes NPRS scale: 0/10 Pain location:  left upper arm Pain orientation: Bilateral  PAIN TYPE: aching Pain description: intermittent  Aggravating factors: right sidelying with arm bent; leaning on elbow, feel on left side with petting the dogs prolonged abduction; feel it with taking off a pullover shirt Relieving factors: no pain at rest; doesn't hurt to carry things or vacumn PRECAUTIONS: None  WEIGHT BEARING RESTRICTIONS: No  FALLS:  Has patient fallen in last 6 months? No  PLOF: Independent  PATIENT GOALS:find out why this happening  NEXT MD VISIT:   OBJECTIVE:  Note: Objective measures were completed at Evaluation unless otherwise noted.  DIAGNOSTIC FINDINGS:  none  PATIENT SURVEYS:  Quick Dash:  QUICK DASH  Please rate your ability do the following activities in the last week by selecting the number below the appropriate response.   Activities Rating  Open a tight or new jar.  3 = Moderate difficulty  Do heavy household chores (e.g., wash walls, floors). 1 = No difficulty   Carry a shopping bag or briefcase 1 = No difficulty   Wash your back. 1 = No difficulty   Use a  knife to cut food. 1 = No difficulty   Recreational activities in which you take some force or impact through your arm, shoulder or hand (e.g., golf, hammering, tennis, etc.). 1 = No difficulty   During the past week, to what extent has your arm, shoulder or hand problem interfered with your normal social activities with family, friends, neighbors or groups?  1 = Not at all  During the past week, were you limited in your work or other regular daily activities as a result of your arm, shoulder or hand problem? 1 = Not limited at all  Rate the severity of the following symptoms in the last week: Arm, Shoulder, or hand pain. 1 = none  Rate the severity of the following symptoms in the last week: Tingling (pins and needles) in your arm, shoulder or hand. 1 = none  During the past week, how much difficulty have you had sleeping because of the pain in your arm, shoulder or hand?  3 = Moderate difficulty   (A QuickDASH score may not be calculated if there is greater than 1 missing item.)  Quick Dash Disability/Symptom Score: 9.1%  Minimally Clinically Important Difference (MCID): 15-20 points  Flavio, F. et al. (2013). Minimally clinically important difference of the disabilities of the arm, shoulder, and hand outcome measures (DASH) and its shortened version (Quick DASH). Journal of Orthopaedic & Sports Physical Therapy, 44(1), 30-39)   COGNITION: Overall cognitive status: Within functional limits for tasks assessed     POSTURE: Forward shoulders  UPPER EXTREMITY ROM:   Active ROM Right eval Left eval  Shoulder flexion 143 pulls upper arm 149  Shoulder extension    Shoulder abduction 128 mild pull 148  Shoulder adduction    Shoulder internal rotation L1 sore T8  Shoulder external rotation 77 sore/C7 82/T1  Elbow flexion    Elbow extension    Wrist flexion    Wrist extension    Wrist ulnar deviation  Wrist radial deviation    Wrist pronation    Wrist supination    (Blank  rows = not tested)  UPPER EXTREMITY MMT:  MMT Right eval Left eval  Shoulder flexion 4 4+  Shoulder extension    Shoulder abduction 4 4+  Shoulder adduction    Shoulder internal rotation 4 4+  Shoulder external rotation 4 4+  Middle trapezius 4- 4  Lower trapezius 4- 4  Elbow flexion    Elbow extension    Wrist flexion    Wrist extension    Wrist ulnar deviation    Wrist radial deviation    Wrist pronation    Wrist supination    Grip strength (lbs)    (Blank rows = not tested)  SHOULDER SPECIAL TESTS: Negative drop arm; negative Vonzell Berber, negative active compression test   JOINT MOBILITY TESTING:  Right > left glenohumeral hypomobility in all directions; decreased thoracic extension mobility  PALPATION:  Decreased bil pectoral lengths                                                                                                                             TREATMENT DATE:  03/15/24 Seated thoracic extension with ball 10x Standing counter force Pulleys 3#:  flexion, scaption, abduction 10x right/left (pt states she can replicate with her Bowflex at home) (Added to HEP- see below) Supine serratus punch 5# 2x10 (Added to HEP- see below) 3 way raises with 2# wts 1x10 (Added to HEP- see below) Standing Lat pull 30# x 20 (Added to HEP- see below) Review of stretching: pt likes doorway stretch best Pt would like to do HEP for 2 weeks  and then follow up  Discussed daily stretching and resistive ex's with bands or weights 3-4x/week     03/08/24 Pulleys flex x 2 min,  IR x 1 min (not much stretch) Reviewed: Standing wall slide, Seated scap retraction, seated thoracic ext with pec stretch 3 way raises with 2# wts 1x10 Standing B ER with green band 2x10 Row with blue band x 20 Shoulder ext blue band x 20 Horizontal ABD green x 20 Lat pull 30# x 20 IR green x20 in door ER green x 20 in door 3 way reach with blue loop x 5 B   02/29/24 Evaluation Discussed  risk factors for adhesive capsulitis: gender, diabetes, age Initial HEP as below   PATIENT EDUCATION: Education details: Educated patient on anatomy and physiology of current symptoms, prognosis, plan of care as well as initial self care strategies to promote recovery Person educated: Patient Education method: Explanation Education comprehension: verbalized understanding  HOME EXERCISE PROGRAM: Access Code: [email or web address] URL: [email or web address] Date: 03/15/2024 Prepared by: Glade Pesa  Exercises - Supine Shoulder Flexion Overhead with Dowel (Mirrored)  - 1 x daily - 7 x weekly - 1 sets - 3 reps - 30 hold - Seated Shoulder Flexion Slide at Table Top with Forearm in Neutral  - 1 x daily - 7  x weekly - 1 sets - 3 reps - 30 hold - Standing Single Shoulder Flexion Wall Slide with Palm Up  - 1 x daily - 7 x weekly - 3 sets - 3 reps - 30 hold - Seated Scapular Retraction  - 1 x daily - 7 x weekly - 1 sets - 10 reps - Seated Thoracic Lumbar Extension with Pectoralis Stretch  - 1 x daily - 7 x weekly - 1 sets - 10 reps - Shoulder External Rotation with Anchored Resistance  - 1 x daily - 3 x weekly - 1-3 sets - 10 reps - Shoulder Internal Rotation with Resistance  - 1 x daily - 3 x weekly - 1-3 sets - 10 reps - Standing Shoulder Row with Anchored Resistance  - 1 x daily - 3 x weekly - 1-3 sets - 10 reps - Shoulder extension with resistance - Neutral  - 1 x daily - 3 x weekly - 2 sets - 10 reps - Standing Shoulder Horizontal Abduction with Resistance  - 1 x daily - 3 x weekly - 2 sets - 10 reps - Standing Plank on Wall with Reaches and Resistance  - 1 x daily - 3 x weekly - 2 sets - 10 reps - Standing Shoulder Flexion to 90 Degrees  - 1 x daily - 3 x weekly - 1-3 sets - 10 reps - 3 sec hold - Standing Shoulder Scaption  - 1 x daily - 3 x weekly - 1-3 sets - 10 reps - 3 sec hold - Shoulder Abduction - Thumbs Up  - 1 x daily - 3 x weekly - 1-3 sets - 10 reps - 3 sec hold - Standing  Shoulder Flexion AAROM with Pulley in Front  - 1 x daily - 7 x weekly - 3 sets - 10 reps - Supine Scapular Protraction in Flexion with Dumbbells  - 1 x daily - 7 x weekly - 2 sets - 10 reps - Standing Shoulder Flexion to 90 Degrees with Dumbbells  - 1 x daily - 7 x weekly - 1 sets - 10 reps - Scaption with Dumbbells  - 1 x daily - 7 x weekly - 1 sets - 10 reps - Shoulder Abduction with Dumbbells - Thumbs Up  - 1 x daily - 7 x weekly - 10 reps - Lat Pull Down  - 1 x daily - 7 x weekly - 2 sets - 10 reps - Standing Lat Pull Down with Resistance - Elbows Bent  - 1 x daily - 7 x weekly - 1 sets - 10 reps ASSESSMENT:  CLINICAL IMPRESSION: Pain levels remain in shoulders.  She demonstrates good carryover with initial HEP.  HEP updated to include progressions. Therapist providing verbal cues to optimize technique with  exercises in order to achieve the greatest benefit.  Will follow up in 2 weeks to assess response and progress or modify as needed.   Eval: Patient is a 68 y.o. female who was seen today for physical therapy evaluation and treatment for right and left upper arm pain and stiffness.  The patient would benefit from skilled PT to address shoulder range of motion limitations particularly shoulder elevation and internal rotation as well as glenohumeral, scapular and thoracic joint hypomobility.  She would benefit from establishment of a HEP particularly to address ROM given she has risk factors for developing adhesive capsulitis. The patient would also benefit from strengthening to correct asymmetries including weakness in rotator cuff and periscapular musculature.   OBJECTIVE  IMPAIRMENTS: decreased activity tolerance, decreased ROM, decreased strength, hypomobility, impaired perceived functional ability, and pain.   ACTIVITY LIMITATIONS: sleeping, dressing, and caring for others  PARTICIPATION LIMITATIONS: taking care of dogs  PERSONAL FACTORS: Age and 1 comorbidity: diabetes are also  affecting patient's functional outcome.   REHAB POTENTIAL: Good  CLINICAL DECISION MAKING: Stable/uncomplicated  EVALUATION COMPLEXITY: Low   GOALS: Goals reviewed with patient? Yes  SHORT TERM GOALS: Target date: 03/28/2024    The patient will demonstrate knowledge of basic self care strategies and exercises to promote healing  Baseline: Goal status: met 11/19  2.  The patient will have improved shoulder elevation ROM to at least 147 degrees needed for grooming/dressing purposes as well as reaching high shelves  Baseline:  Goal status: INITIAL  3.  Shoulder internal rotation to T10 needed for dressing tasks Baseline:  Goal status: INITIAL    LONG TERM GOALS: Target date: 04/25/2024    The patient will be independent in a safe self progression of a home exercise program to promote further recovery of function  Baseline:  Goal status: INITIAL  2.  The patient will report a 60% improvement in pain levels with functional activities which are currently difficult including sleeping on her sides, petting the dogs, taking on/off a pullover shirt Baseline:  Goal status: INITIAL  3.  The patient will have improved shoulder elevation ROM to at least 153 degrees needed for grooming/dressing purposes as well as reaching high shelves  Baseline:  Goal status: INITIAL  4.  Shoulder internal rotation to 70 degrees needed for dressing tasks Baseline:  Goal status: INITIAL                   PLAN:  PT FREQUENCY: 1x/week  PT DURATION: 8 weeks  PLANNED INTERVENTIONS: 02835- PT Re-evaluation, 97750- Physical Performance Testing, 97110-Therapeutic exercises, 97530- Therapeutic activity, 97112- Neuromuscular re-education, 97535- Self Care, 02859- Manual therapy, [phone]- Aquatic Therapy, H9716- Electrical stimulation (unattended), [phone]- Electrical stimulation (manual), [id number]- Ultrasound, [id number]- Ionotophoresis 4mg /ml Dexamethasone, 79439 (1-2 muscles), 20561 (3+ muscles)- Dry  Needling, Patient/Family education, Taping, Joint mobilization, Cryotherapy, and Moist heat  PLAN FOR NEXT SESSION: follow up in 2 weeks; recheck shoulder ROM; review HEP and progress; continue strengthening;  right glenohumeral and scapular joint mobs  Glade Pesa, PT 03/15/24 7:05 PM Phone: [phone] Fax: [phone]

## 2024-03-21 ENCOUNTER — Ambulatory Visit: Admitting: Physical Therapy

## 2024-03-22 ENCOUNTER — Encounter: Payer: Self-pay | Admitting: *Deleted

## 2024-03-22 DIAGNOSIS — Z006 Encounter for examination for normal comparison and control in clinical research program: Secondary | ICD-10-CM

## 2024-03-22 NOTE — Research (Signed)
 Spoke with [name] as part of [facility]. She states she is doing good. She is Tolerating Repatha  and is excited about her labs. Reviewed meds. No changes noted. No c/o pain or any A/E's since we last spoke. Informed her that I will call her again in May.   Current Outpatient Medications:    cholecalciferol (VITAMIN D3) 25 MCG (1000 UNIT) tablet, Take 2,000 Units by mouth daily., Disp: , Rfl:    Evolocumab  (REPATHA  SURECLICK) 140 MG/ML SOAJ, Inject 140 mg into the skin every 14 (fourteen) days., Disp: 6 mL, Rfl: 3   glimepiride (AMARYL) 1 MG tablet, Take 0.5 mg by mouth every morning., Disp: , Rfl:    irbesartan (AVAPRO) 150 MG tablet, Take 150 mg by mouth daily., Disp: , Rfl:    metFORMIN (GLUCOPHAGE) 1000 MG tablet, Take 1,000 mg by mouth 2 (two) times daily with a meal., Disp: , Rfl:    rosuvastatin  (CRESTOR ) 20 MG tablet, Take 1 tablet (20 mg total) by mouth at bedtime., Disp: 90 tablet, Rfl: 3  Current Facility-Administered Medications:    Study - VICTORION-1 PREVENT - inclisiran 300 mg/1.5mL or placebo SQ injection (PI-Stuckey), 300 mg, Subcutaneous, Q6 months, , 300 mg at 03/29/23 0831

## 2024-03-26 ENCOUNTER — Encounter: Payer: Self-pay | Admitting: Cardiovascular Disease

## 2024-03-29 ENCOUNTER — Ambulatory Visit: Admitting: Physical Therapy

## 2024-03-29 ENCOUNTER — Encounter: Payer: Self-pay | Admitting: Physical Therapy

## 2024-03-29 DIAGNOSIS — M25511 Pain in right shoulder: Secondary | ICD-10-CM | POA: Diagnosis present

## 2024-03-29 DIAGNOSIS — M25611 Stiffness of right shoulder, not elsewhere classified: Secondary | ICD-10-CM | POA: Diagnosis present

## 2024-03-29 DIAGNOSIS — M6281 Muscle weakness (generalized): Secondary | ICD-10-CM | POA: Diagnosis present

## 2024-03-29 DIAGNOSIS — M25512 Pain in left shoulder: Secondary | ICD-10-CM | POA: Diagnosis present

## 2024-03-29 NOTE — Therapy (Signed)
 OUTPATIENT PHYSICAL THERAPY SHOULDER PROGRESS NOTE   Patient Name: [name] MRN: [id number] DOB:March 27, 1956, 68 y.o., female Today's Date: 03/29/2024  END OF SESSION:  PT End of Session - 03/29/24 0918     Visit Number 4    Date for Recertification  04/25/24    Authorization Type [employer] Medicare 8 visits 11/4-12/30; [employer] questionaire too    Authorization - Visit Number 4    Authorization - Number of Visits 8    Progress Note Due on Visit 10    PT Start Time 0920    PT Stop Time 1005    PT Time Calculation (min) 45 min    Activity Tolerance Patient tolerated treatment well           Past Medical History:  Diagnosis Date   Aortic stenosis    Diabetes mellitus without complication (HCC) 2010   Diverticulitis 06/2015   High cholesterol 1987   Hypertension 2017   Obesity    Ureteral stone 06/2015   Past Surgical History:  Procedure Laterality Date   BREAST BIOPSY Left 11/10/2005   benign   CESAREAN SECTION  1978   1982 second   LAPAROSCOPIC HYSTERECTOMY Bilateral 2008   TONSILECTOMY, ADENOIDECTOMY, BILATERAL MYRINGOTOMY AND TUBES  1976   Patient Active Problem List   Diagnosis Date Noted   Abdominal pain 06/30/2015   Diverticulitis 06/30/2015   Ureteral stone 06/30/2015   Hydronephrosis 06/30/2015   Lower urinary tract infectious disease 06/30/2015   Acute kidney injury 06/30/2015   Diabetes mellitus without complication (HCC)    High cholesterol    Obesity    Acute diverticulitis     PCP: Regino Dibas MD  REFERRING PROVIDER: Regino Dibas MD  REFERRING DIAG: M25.511 right shoulder pain  THERAPY DIAG:  Right shoulder pain; right shoulder stiffness; weakness; left shoulder pain  Rationale for Evaluation and Treatment: Rehabilitation  ONSET DATE: last few months  SUBJECTIVE:                                                                                                                                                                                       SUBJECTIVE STATEMENT: Going OK.  All the arthritis is showing up in this weather.   The doctor sent me bc of the stiffness in the shoulder but with exercises I hurt a little more.  Painful with right sidesleeping.    Eval: I went to the doctor's b/c I had right breast pain.  He wasn't happy with my lack of motion right shoulder. No shoulder pain but rather upper arm pain and difficulty moving her arm. Left arm as well as right arm.   I  think this happened 1 year ago bc of cholesterol medicine. Has been helping with husband's feeding tube but I don't think that caused this (throat cancer)   Has 2 Lab dogs Has home TENS Sews Has home gym with [employer], kettlebells, foam roll Hand dominance: Right  PERTINENT HISTORY: DM; HTN; aortic valve stenosis (being watched); right knee OA (scoped)  PAIN:   Are you having pain? Yes NPRS scale: 2/10 Pain location:  right shoulder; left upper arm only with sitting too long Pain orientation: Bilateral  PAIN TYPE: aching Pain description: intermittent  Aggravating factors: right sidelying with arm bent; leaning on elbow, feel on left side with petting the dogs prolonged abduction; feel it with taking off a pullover shirt Relieving factors: no pain at rest; doesn't hurt to carry things or vacumn PRECAUTIONS: None  WEIGHT BEARING RESTRICTIONS: No  FALLS:  Has patient fallen in last 6 months? No  PLOF: Independent  PATIENT GOALS:find out why this happening  NEXT MD VISIT:   OBJECTIVE:  Note: Objective measures were completed at Evaluation unless otherwise noted.  DIAGNOSTIC FINDINGS:  none  PATIENT SURVEYS:  Quick Dash:  QUICK DASH  Please rate your ability do the following activities in the last week by selecting the number below the appropriate response.   Activities Rating  Open a tight or new jar.  3 = Moderate difficulty  Do heavy household chores (e.g., wash walls, floors). 1 = No difficulty   Carry a shopping bag or  briefcase 1 = No difficulty   Wash your back. 1 = No difficulty   Use a knife to cut food. 1 = No difficulty   Recreational activities in which you take some force or impact through your arm, shoulder or hand (e.g., golf, hammering, tennis, etc.). 1 = No difficulty   During the past week, to what extent has your arm, shoulder or hand problem interfered with your normal social activities with family, friends, neighbors or groups?  1 = Not at all  During the past week, were you limited in your work or other regular daily activities as a result of your arm, shoulder or hand problem? 1 = Not limited at all  Rate the severity of the following symptoms in the last week: Arm, Shoulder, or hand pain. 1 = none  Rate the severity of the following symptoms in the last week: Tingling (pins and needles) in your arm, shoulder or hand. 1 = none  During the past week, how much difficulty have you had sleeping because of the pain in your arm, shoulder or hand?  3 = Moderate difficulty   (A QuickDASH score may not be calculated if there is greater than 1 missing item.)  Quick Dash Disability/Symptom Score: 9.1%  Minimally Clinically Important Difference (MCID): 15-20 points  Flavio, F. et al. (2013). Minimally clinically important difference of the disabilities of the arm, shoulder, and hand outcome measures (DASH) and its shortened version (Quick DASH). Journal of Orthopaedic & Sports Physical Therapy, 44(1), 30-39)   COGNITION: Overall cognitive status: Within functional limits for tasks assessed     POSTURE: Forward shoulders  UPPER EXTREMITY ROM:   Active ROM Right eval Left eval 12/3 R 12/3 L  Shoulder flexion 143 pulls upper arm 149 R 154 L 152  Shoulder extension      Shoulder abduction 128 mild pull 148 R 153  L 156  Shoulder adduction      Shoulder internal rotation L1 sore T8 R Thumb to T9 L  T8  Shoulder external rotation 77 sore/C7 82/T1 R 77 L 81   Elbow flexion      Elbow  extension      Wrist flexion      Wrist extension      Wrist ulnar deviation      Wrist radial deviation      Wrist pronation      Wrist supination      (Blank rows = not tested)  UPPER EXTREMITY MMT:  MMT Right eval Left eval  Shoulder flexion 4 4+  Shoulder extension    Shoulder abduction 4 4+  Shoulder adduction    Shoulder internal rotation 4 4+  Shoulder external rotation 4 4+  Middle trapezius 4- 4  Lower trapezius 4- 4  Elbow flexion    Elbow extension    Wrist flexion    Wrist extension    Wrist ulnar deviation    Wrist radial deviation    Wrist pronation    Wrist supination    Grip strength (lbs)    (Blank rows = not tested)  SHOULDER SPECIAL TESTS: Negative drop arm; negative Vonzell Berber, negative active compression test   JOINT MOBILITY TESTING:  Right > left glenohumeral hypomobility in all directions; decreased thoracic extension mobility  PALPATION:  Decreased bil pectoral lengths                                                                                                                             TREATMENT DATE:  03/29/24 UBE 3 min while discussing status and progress Shoulder ROM review and areas of emphasis  Supine serratus punch 5# x20 right/left Seated bicep curls 5# 10x right/left Counter push ups 10x  3 way raises with 2# wts 1x10  Standing Lat pull 30# x 20  Review of thoracic extension with foam roll before, during or after sewing Review of stretching: little bits often throughout the day  resistive ex's with bands or weights 3-4x/week  03/15/24 Seated thoracic extension with ball 10x Standing counter force Pulleys 3#:  flexion, scaption, abduction 10x right/left (pt states she can replicate with her Bowflex at home) (Added to HEP- see below) Supine serratus punch 5# 2x10 (Added to HEP- see below) 3 way raises with 2# wts 1x10 (Added to HEP- see below) Standing Lat pull 30# x 20 (Added to HEP- see below) Review of  stretching: pt likes doorway stretch best Pt would like to do HEP for 2 weeks  and then follow up  Discussed daily stretching and resistive ex's with bands or weights 3-4x/week     03/08/24 Pulleys flex x 2 min,  IR x 1 min (not much stretch) Reviewed: Standing wall slide, Seated scap retraction, seated thoracic ext with pec stretch 3 way raises with 2# wts 1x10 Standing B ER with green band 2x10 Row with blue band x 20 Shoulder ext blue band x 20 Horizontal ABD green x 20 Lat pull 30# x 20 IR green x20  in door ER green x 20 in door 3 way reach with blue loop x 5 B   02/29/24 Evaluation Discussed risk factors for adhesive capsulitis: gender, diabetes, age Initial HEP as below   PATIENT EDUCATION: Education details: Educated patient on anatomy and physiology of current symptoms, prognosis, plan of care as well as initial self care strategies to promote recovery Person educated: Patient Education method: Explanation Education comprehension: verbalized understanding  HOME EXERCISE PROGRAM: Access Code: [email or web address] URL: [email or web address] Date: 03/15/2024 Prepared by: Glade Pesa  Exercises - Supine Shoulder Flexion Overhead with Dowel (Mirrored)  - 1 x daily - 7 x weekly - 1 sets - 3 reps - 30 hold - Seated Shoulder Flexion Slide at Table Top with Forearm in Neutral  - 1 x daily - 7 x weekly - 1 sets - 3 reps - 30 hold - Standing Single Shoulder Flexion Wall Slide with Palm Up  - 1 x daily - 7 x weekly - 3 sets - 3 reps - 30 hold - Seated Scapular Retraction  - 1 x daily - 7 x weekly - 1 sets - 10 reps - Seated Thoracic Lumbar Extension with Pectoralis Stretch  - 1 x daily - 7 x weekly - 1 sets - 10 reps - Shoulder External Rotation with Anchored Resistance  - 1 x daily - 3 x weekly - 1-3 sets - 10 reps - Shoulder Internal Rotation with Resistance  - 1 x daily - 3 x weekly - 1-3 sets - 10 reps - Standing Shoulder Row with Anchored Resistance  - 1 x daily - 3  x weekly - 1-3 sets - 10 reps - Shoulder extension with resistance - Neutral  - 1 x daily - 3 x weekly - 2 sets - 10 reps - Standing Shoulder Horizontal Abduction with Resistance  - 1 x daily - 3 x weekly - 2 sets - 10 reps - Standing Plank on Wall with Reaches and Resistance  - 1 x daily - 3 x weekly - 2 sets - 10 reps - Standing Shoulder Flexion to 90 Degrees  - 1 x daily - 3 x weekly - 1-3 sets - 10 reps - 3 sec hold - Standing Shoulder Scaption  - 1 x daily - 3 x weekly - 1-3 sets - 10 reps - 3 sec hold - Shoulder Abduction - Thumbs Up  - 1 x daily - 3 x weekly - 1-3 sets - 10 reps - 3 sec hold - Standing Shoulder Flexion AAROM with Pulley in Front  - 1 x daily - 7 x weekly - 3 sets - 10 reps - Supine Scapular Protraction in Flexion with Dumbbells  - 1 x daily - 7 x weekly - 2 sets - 10 reps - Standing Shoulder Flexion to 90 Degrees with Dumbbells  - 1 x daily - 7 x weekly - 1 sets - 10 reps - Scaption with Dumbbells  - 1 x daily - 7 x weekly - 1 sets - 10 reps - Shoulder Abduction with Dumbbells - Thumbs Up  - 1 x daily - 7 x weekly - 10 reps - Lat Pull Down  - 1 x daily - 7 x weekly - 2 sets - 10 reps - Standing Lat Pull Down with Resistance - Elbows Bent  - 1 x daily - 7 x weekly - 1 sets - 10 reps ASSESSMENT:  CLINICAL IMPRESSION: The patient has much improved right shoulder ROM particularly abduction.  She is able to demonstrate HEP with  min verbal cues.  Therapist monitoring response and effect on pain.  Mild 2/10 post ex soreness at the end of session.  She has concerns about her high co-pay and requests to cancel other appts and follow up one last time at the end of the month.      Eval: Patient is a 68 y.o. female who was seen today for physical therapy evaluation and treatment for right and left upper arm pain and stiffness.  The patient would benefit from skilled PT to address shoulder range of motion limitations particularly shoulder elevation and internal rotation as well as  glenohumeral, scapular and thoracic joint hypomobility.  She would benefit from establishment of a HEP particularly to address ROM given she has risk factors for developing adhesive capsulitis. The patient would also benefit from strengthening to correct asymmetries including weakness in rotator cuff and periscapular musculature.   OBJECTIVE IMPAIRMENTS: decreased activity tolerance, decreased ROM, decreased strength, hypomobility, impaired perceived functional ability, and pain.   ACTIVITY LIMITATIONS: sleeping, dressing, and caring for others  PARTICIPATION LIMITATIONS: taking care of dogs  PERSONAL FACTORS: Age and 1 comorbidity: diabetes are also affecting patient's functional outcome.   REHAB POTENTIAL: Good  CLINICAL DECISION MAKING: Stable/uncomplicated  EVALUATION COMPLEXITY: Low   GOALS: Goals reviewed with patient? Yes  SHORT TERM GOALS: Target date: 03/28/2024    The patient will demonstrate knowledge of basic self care strategies and exercises to promote healing  Baseline: Goal status: met 11/19  2.  The patient will have improved shoulder elevation ROM to at least 147 degrees needed for grooming/dressing purposes as well as reaching high shelves  Baseline:  Goal status:met 12/3  3.  Shoulder internal rotation to T10 needed for dressing tasks Baseline:  Goal status: met 12/3    LONG TERM GOALS: Target date: 04/25/2024    The patient will be independent in a safe self progression of a home exercise program to promote further recovery of function  Baseline:  Goal status: INITIAL  2.  The patient will report a 60% improvement in pain levels with functional activities which are currently difficult including sleeping on her sides, petting the dogs, taking on/off a pullover shirt Baseline:  Goal status: INITIAL  3.  The patient will have improved shoulder elevation ROM to at least 153 degrees needed for grooming/dressing purposes as well as reaching high shelves   Baseline:  Goal status: INITIAL  4.  Shoulder internal rotation to 70 degrees needed for dressing tasks Baseline:  Goal status: met 12/3                   PLAN:  PT FREQUENCY: 1x/week  PT DURATION: 8 weeks  PLANNED INTERVENTIONS: 97164- PT Re-evaluation, 97750- Physical Performance Testing, 97110-Therapeutic exercises, 97530- Therapeutic activity, 97112- Neuromuscular re-education, 97535- Self Care, [id number]- Manual therapy, [phone]- Aquatic Therapy, [id number]- Electrical stimulation (unattended), [phone]- Electrical stimulation (manual), [id number]- Ultrasound, [id number]- Ionotophoresis 4mg /ml Dexamethasone, 79439 (1-2 muscles), 20561 (3+ muscles)- Dry Needling, Patient/Family education, Taping, Joint mobilization, Cryotherapy, and Moist heat  PLAN FOR NEXT SESSION: pt requests just one more visit secondary to high co-pay;  probable discharge; review HEP and progress; continue strengthening;  right glenohumeral and scapular joint mobs  Glade Pesa, PT 03/29/24 10:13 AM Phone: [phone] Fax: [phone]

## 2024-04-07 ENCOUNTER — Ambulatory Visit: Admitting: Physical Therapy

## 2024-04-12 ENCOUNTER — Ambulatory Visit (HOSPITAL_COMMUNITY): Admission: RE | Admit: 2024-04-12 | Discharge: 2024-04-12 | Attending: Cardiology | Admitting: Cardiology

## 2024-04-12 ENCOUNTER — Encounter (HOSPITAL_COMMUNITY): Payer: Self-pay

## 2024-04-12 ENCOUNTER — Ambulatory Visit: Payer: Self-pay | Admitting: Cardiovascular Disease

## 2024-04-12 DIAGNOSIS — I350 Nonrheumatic aortic (valve) stenosis: Secondary | ICD-10-CM | POA: Diagnosis present

## 2024-04-12 LAB — ECHOCARDIOGRAM COMPLETE
AR max vel: 0.78 cm2
AV Area VTI: 0.77 cm2
AV Area mean vel: 0.72 cm2
AV Mean grad: 62 mmHg
AV Peak grad: 98.8 mmHg
Ao pk vel: 4.97 m/s
Area-P 1/2: 3.54 cm2
MV M vel: 5.86 m/s
MV Peak grad: 137.4 mmHg
Radius: 0.5 cm
S' Lateral: 2.9 cm

## 2024-04-12 NOTE — Progress Notes (Signed)
 Contacted the echo reader, Dr. Mona, regarding the increase in the patients aortic valve mean gradient. The patient has a follow-up appointment scheduled with Dr. Verlin tomorrow. Patient was discharged per Dr. Mona. [name]

## 2024-04-13 ENCOUNTER — Encounter: Payer: Self-pay | Admitting: Cardiovascular Disease

## 2024-04-13 ENCOUNTER — Ambulatory Visit: Attending: Cardiovascular Disease | Admitting: Cardiovascular Disease

## 2024-04-13 VITALS — BP 128/82 | HR 81 | Ht 59.5 in | Wt 178.8 lb

## 2024-04-13 DIAGNOSIS — I350 Nonrheumatic aortic (valve) stenosis: Secondary | ICD-10-CM

## 2024-04-13 NOTE — H&P (View-Only) (Signed)
 Structural Heart Clinic Note  Chief Complaint  Patient presents with   Follow-up    Aortic stenosis    History of Present Illness: 68 yo female with history of CAD, DM, HLD, HTN, obesity and severe aortic stenosis who is here today for follow up. I saw her as a new consult in the structural heart clinic in July 2025 for further discussion regarding her aortic stenosis. She has CAD with abnormal coronary calcium  score of 130. She has not had a cardiac cath. She has been followed for moderate aortic stenosis. Echo June 2025 with LVEF=65-70%. Severe aortic stenosis with mean gradient 39 mmHg, AVA 0.93 cm2, DI 0.29. She was seen in the [facility] office 10/08/23 by Katlyn West, NP and reported progressive dyspnea with exertion. Dr. Barbaraann recommended referral to the structural clinic as he felt that her aortic stenosis was most likely severe. When I saw her in July 2025 she described some dyspnea on exertion but she was overall feeling well and her valve criteria was on the borderline of moderate/severe so we elected to follow her aortic stenosis with serial imaging. Echo 04/12/24 with LVEF=65-70%. Mild to moderate MR. Severe aortic stenosis with mean gradient 62 mmHg, AVA 0.72 cm2, DI 0.25.   She is here today for follow up. She has been having progressive dyspnea with exertion and fatigue. She denies chest pain, palpitations, lower extremity edema, orthopnea, PND, dizziness, near syncope or syncope.   She lives in [place] with her husband who is being treated for throat cancer. She is retired as a radiographer, therapeutic. She has no active dental issues.   Primary Care Physician: Regino Slater, MD Primary Cardiologist: O'Neal Referring Cardiologist: O'Neal  Past Medical History:  Diagnosis Date   Aortic stenosis    Diabetes mellitus without complication (HCC) 2010   Diverticulitis 06/2015   High cholesterol 1987   Hypertension 2017   Obesity    Ureteral stone 06/2015    Past Surgical  History:  Procedure Laterality Date   BREAST BIOPSY Left 11/10/2005   benign   CESAREAN SECTION  1978   1982 second   LAPAROSCOPIC HYSTERECTOMY Bilateral 2008   TONSILECTOMY, ADENOIDECTOMY, BILATERAL MYRINGOTOMY AND TUBES  1976    Current Outpatient Medications  Medication Sig Dispense Refill   cholecalciferol (VITAMIN D3) 25 MCG (1000 UNIT) tablet Take 2,000 Units by mouth daily.     Evolocumab  (REPATHA  SURECLICK) 140 MG/ML SOAJ Inject 140 mg into the skin every 14 (fourteen) days. 6 mL 3   glimepiride (AMARYL) 1 MG tablet Take 0.5 mg by mouth every morning.     irbesartan (AVAPRO) 150 MG tablet Take 150 mg by mouth daily.     metFORMIN (GLUCOPHAGE) 1000 MG tablet Take 1,000 mg by mouth 2 (two) times daily with a meal.     rosuvastatin  (CRESTOR ) 20 MG tablet Take 1 tablet (20 mg total) by mouth at bedtime. 90 tablet 3   Current Facility-Administered Medications  Medication Dose Route Frequency Provider Last Rate Last Admin   Study - VICTORION-1 PREVENT - inclisiran 300 mg/1.5mL or placebo SQ injection (PI-Stuckey)  300 mg Subcutaneous Q6 months    300 mg at 03/29/23 0831    Allergies  Allergen Reactions   Penicillins Rash    Confirmed with patient no SOB, lip/tongue swelling    Social History   Socioeconomic History   Marital status: Married    Spouse name: Not on file   Number of children: 2   Years of education: Not on  file   Highest education level: Not on file  Occupational History   Occupation: Retired Engineer, Civil (consulting)  Tobacco Use   Smoking status: Former   Smokeless tobacco: Never  Advertising Account Planner   Vaping status: Never Used  Substance and Sexual Activity   Alcohol use: Yes    Alcohol/week: 2.0 standard drinks of alcohol    Types: 2 Glasses of wine per week    Comment: on the weekends   Drug use: No   Sexual activity: Not on file  Other Topics Concern   Not on file  Social History Narrative   Not on file   Social Drivers of Health   Tobacco Use: Medium  Risk (04/13/2024)   Patient History    Smoking Tobacco Use: Former    Smokeless Tobacco Use: Never    Passive Exposure: Not on Actuary Strain: Not on file  Food Insecurity: No Food Insecurity (02/12/2022)   Hunger Vital Sign    Worried About Running Out of Food in the Last Year: Never true    Ran Out of Food in the Last Year: Never true  Transportation Needs: No Transportation Needs (02/12/2022)   PRAPARE - Administrator, Civil Service (Medical): No    Lack of Transportation (Non-Medical): No  Physical Activity: Not on file  Stress: Not on file  Social Connections: Unknown (09/09/2021)   Received from [facility]   Social Network    Social Network: Not on file  Intimate Partner Violence: Unknown (08/01/2021)   Received from [facility]   HITS    Physically Hurt: Not on file    Insult or Talk Down To: Not on file    Threaten Physical Harm: Not on file    Scream or Curse: Not on file  Depression (EYV7-0): Not on file  Alcohol Screen: Not on file  Housing: Low Risk (02/12/2022)   Housing    Last Housing Risk Score: 0  Utilities: Not on file  Health Literacy: Not on file    Family History  Problem Relation Age of Onset   Hyperlipidemia Mother    High Cholesterol Mother    Diabetes Mother    Cancer Mother    Coronary artery disease Mother    Cancer Father    Hypertension Sister    Kidney disease Sister    Diabetes Sister     Review of Systems:  As stated in the HPI and otherwise negative.   BP 128/82   Pulse 81   Ht 4' 11.5 (1.511 m)   Wt 178 lb 12.8 oz (81.1 kg)   SpO2 97%   BMI 35.51 kg/m   Physical Examination: General: Well developed, well nourished, NAD  HEENT: OP clear, mucus membranes moist  SKIN: warm, dry. No rashes. Neuro: No focal deficits  Musculoskeletal: Muscle strength 5/5 all ext  Psychiatric: Mood and affect normal  Neck: No JVD, no carotid bruits, no thyromegaly, no lymphadenopathy.  Lungs:Clear  bilaterally, no wheezes, rhonci, crackles Cardiovascular: Regular rate and rhythm. Harsh systolic murmur.  Abdomen:Soft. Bowel sounds present. Non-tender.  Extremities: No lower extremity edema.   EKG:  EKG is ordered today. The ekg ordered today demonstrates  EKG Interpretation Date/Time:  Thursday April 13 2024 13:39:30 EST Ventricular Rate:  84 PR Interval:  142 QRS Duration:  70 QT Interval:  362 QTC Calculation: 427 R Axis:   -7  Text Interpretation: Normal sinus rhythm Left ventricular hypertrophy with repolarization abnormality ( R in aVL ) When compared  with ECG of 08-Oct-2023 14:59, No significant change was found Confirmed by Verlin Bruckner [phone]) on 04/13/2024 1:42:59 PM   Echo December 2025:   1. Left ventricular ejection fraction, by estimation, is 65 to 70%. Left  ventricular ejection fraction by 3D volume is 67 %. The left ventricle has  normal function. The left ventricle has no regional wall motion  abnormalities. Left ventricular diastolic   parameters are consistent with Grade I diastolic dysfunction (impaired  relaxation). The average left ventricular global longitudinal strain is  -19.8 %. The global longitudinal strain is normal.   2. Right ventricular systolic function is normal. The right ventricular  size is normal. There is normal pulmonary artery systolic pressure. The  estimated right ventricular systolic pressure is 35.3 mmHg.   3. The mitral valve is degenerative. Mild to moderate mitral valve  regurgitation.   4. The aortic valve is tricuspid. There is severe calcifcation of the  aortic valve. Aortic valve regurgitation is trivial. Severe aortic valve  stenosis. Aortic valve area, by VTI measures 0.77 cm. Aortic valve mean  gradient measures 62.0 mmHg. Aortic  valve Vmax measures 4.97 m/s. Peak gradient 98.8 mmHg, DI 0.25.   5. Aortic dilatation noted. There is borderline dilatation of the  ascending aorta, measuring 37 mm.   6. The  inferior vena cava is normal in size with greater than 50%  respiratory variability, suggesting right atrial pressure of 3 mmHg.   Comparison(s): Changes from prior study are noted. 09/28/2023: LVEF 65-70%,  Severe AS - MG 39 mmHg.   FINDINGS   Left Ventricle: Left ventricular ejection fraction, by estimation, is 65  to 70%. Left ventricular ejection fraction by 3D volume is 67 %. The left  ventricle has normal function. The left ventricle has no regional wall  motion abnormalities. The average  left ventricular global longitudinal strain is -19.8 %. Strain was  performed and the global longitudinal strain is normal. The left  ventricular internal cavity size was normal in size. There is no left  ventricular hypertrophy. Left ventricular diastolic  parameters are consistent with Grade I diastolic dysfunction (impaired  relaxation).   Right Ventricle: The right ventricular size is normal. No increase in  right ventricular wall thickness. Right ventricular systolic function is  normal. There is normal pulmonary artery systolic pressure. The tricuspid  regurgitant velocity is 2.84 m/s, and   with an assumed right atrial pressure of 3 mmHg, the estimated right  ventricular systolic pressure is 35.3 mmHg.   Left Atrium: Left atrial size was normal in size.   Right Atrium: Right atrial size was normal in size.   Pericardium: There is no evidence of pericardial effusion.   Mitral Valve: The mitral valve is degenerative in appearance. There is  mild calcification of the anterior and posterior mitral valve leaflet(s).  Mild to moderate mitral valve regurgitation, with centrally-directed jet.   Tricuspid Valve: The tricuspid valve is grossly normal. Tricuspid valve  regurgitation is trivial.   Aortic Valve: The aortic valve is tricuspid. There is severe calcifcation  of the aortic valve. Aortic valve regurgitation is trivial. Severe aortic  stenosis is present. Aortic valve mean gradient  measures 62.0 mmHg. Aortic  valve peak gradient measures  98.8 mmHg. Aortic valve area, by VTI measures 0.77 cm.   Pulmonic Valve: The pulmonic valve was grossly normal. Pulmonic valve  regurgitation is trivial.   Aorta: Aortic dilatation noted. There is borderline dilatation of the  ascending aorta, measuring 37 mm.   Venous:  The inferior vena cava is normal in size with greater than 50%  respiratory variability, suggesting right atrial pressure of 3 mmHg.   IAS/Shunts: No atrial level shunt detected by color flow Doppler.   Additional Comments: 3D was performed not requiring image post processing  on an independent workstation and was normal.     LEFT VENTRICLE  PLAX 2D  LVIDd:         4.30 cm  LVIDs:         2.90 cm         2D Longitudinal  LV PW:         1.00 cm         Strain  LV IVS:        1.00 cm         2D Strain GLS   -20.9 %  LVOT diam:     2.00 cm         (A4C):  LV SV:         98              2D Strain GLS   -19.8 %  LV SV Index:   56              (A3C):  LVOT Area:     3.14 cm        2D Strain GLS   -18.8 %                                 (A2C):                                 2D Strain GLS   -19.8 %                                 Avg:                                   3D Volume EF                                 LV 3D EF:    Left                                              ventricul                                              ar                                              ejection  fraction                                              by 3D                                              volume is                                              67 %.                                   3D Volume EF:                                 3D EF:        67 %                                 LV EDV:       111 ml                                 LV ESV:       37 ml                                 LV SV:        74 ml   RIGHT  VENTRICLE             IVC  RV Basal diam:  3.00 cm     IVC diam: 1.50 cm  RV Mid diam:    2.70 cm  RV S prime:     12.70 cm/s  PULMONARY VEINS  TAPSE (M-mode): 1.6 cm      A Reversal Velocity: 33.60 cm/s                              Diastolic Velocity:  62.70 cm/s                              S/D Velocity:        1.10                              Systolic Velocity:   69.90 cm/s   LEFT ATRIUM             Index        RIGHT ATRIUM           Index  LA diam:        4.00 cm 2.30 cm/m   RA Area:     14.10 cm  LA Vol (A2C):   49.3 ml 28.41 ml/m  RA  Volume:   33.50 ml  19.30 ml/m  LA Vol (A4C):   32.8 ml 18.90 ml/m  LA Biplane Vol: 42.4 ml 24.43 ml/m   AORTIC VALVE  AV Area (Vmax):    0.78 cm  AV Area (Vmean):   0.72 cm  AV Area (VTI):     0.77 cm  AV Vmax:           497.00 cm/s  AV Vmean:          355.500 cm/s  AV VTI:            1.270 m  AV Peak Grad:      98.8 mmHg  AV Mean Grad:      62.0 mmHg  LVOT Vmax:         124.00 cm/s  LVOT Vmean:        81.700 cm/s  LVOT VTI:          0.312 m  LVOT/AV VTI ratio: 0.25    AORTA  Ao Root diam: 2.30 cm  Ao Asc diam:  3.70 cm   MITRAL VALVE                  TRICUSPID VALVE  MV Area (PHT): 3.54 cm       TR Peak grad:   32.3 mmHg  MV Decel Time: 214 msec       TR Vmax:        284.00 cm/s  MR Peak grad:    137.4 mmHg  MR Mean grad:    87.0 mmHg    SHUNTS  MR Vmax:         586.00 cm/s  Systemic VTI:  0.31 m  MR Vmean:        441.0 cm/s   Systemic Diam: 2.00 cm  MR PISA:         1.57 cm  MR PISA Eff ROA: 9 mm  MR PISA Radius:  0.50 cm  MV E velocity: 109.00 cm/s  MV A velocity: 105.00 cm/s  MV E/A ratio:  1.04   Recent Labs: 10/13/2023: ALT 13    Wt Readings from Last 3 Encounters:  04/13/24 178 lb 12.8 oz (81.1 kg)  11/11/23 171 lb (77.6 kg)  10/08/23 175 lb 12.8 oz (79.7 kg)    Assessment and Plan:   1. Severe Aortic Valve Stenosis: She has severe aortic valve stenosis. NYHA class 2 symptoms. I have personally reviewed  the echo images. The aortic valve is thickened and calcified with limited leaflet mobility. I think she would benefit from AVR. She would be a candidate for conventional AVR by surgical approach or TAVR.    I have reviewed the natural history of aortic stenosis with the patient and their family members  who are present today. We have discussed the limitations of medical therapy and the poor prognosis associated with symptomatic aortic stenosis. We have reviewed potential treatment options, including palliative medical therapy, conventional surgical aortic valve replacement, and transcatheter aortic valve replacement. We discussed treatment options in the context of the patient's specific comorbid medical conditions.   She would like to proceed with planning for TAVR. I will arrange a cardiac catheterization at [facility]  05/11/23 at 11:30am. Risks and benefits of the cath procedure and the valve procedure are reviewed with the patient. After the cath, she will have a cardiac CT, CTA of the chest/abdomen and pelvis and will then be referred to see one of the CT surgeons on our TAVR team.   -  BMET and CBC today    Labs/ tests ordered today include:  Orders Placed This Encounter  Procedures   CBC   Basic Metabolic Panel (BMET)   EKG 12-Lead   Disposition:   F/U with the structural heart team  Signed, Lonni Cash, MD, [facility] 04/13/2024 2:27 PM    [facility] [address] [place], [place], [place]  [address] Phone: [phone]; Fax: [phone]

## 2024-04-13 NOTE — Progress Notes (Signed)
 Pre Surgical Assessment: 5 M Walk Test  28M=16.58ft  5 Meter Walk Test- trial 1: 4.52 seconds 5 Meter Walk Test- trial 2: 4.76 seconds 5 Meter Walk Test- trial 3: 5.06 seconds 5 Meter Walk Test Average: 4.78 seconds

## 2024-04-13 NOTE — Progress Notes (Signed)
 Structural Heart Clinic Note  Chief Complaint  Patient presents with   Follow-up    Aortic stenosis    History of Present Illness: 68 yo female with history of CAD, DM, HLD, HTN, obesity and severe aortic stenosis who is here today for follow up. I saw her as a new consult in the structural heart clinic in July 2025 for further discussion regarding her aortic stenosis. She has CAD with abnormal coronary calcium  score of 130. She has not had a cardiac cath. She has been followed for moderate aortic stenosis. Echo June 2025 with LVEF=65-70%. Severe aortic stenosis with mean gradient 39 mmHg, AVA 0.93 cm2, DI 0.29. She was seen in the [facility] office 10/08/23 by Katlyn West, NP and reported progressive dyspnea with exertion. Dr. Barbaraann recommended referral to the structural clinic as he felt that her aortic stenosis was most likely severe. When I saw her in July 2025 she described some dyspnea on exertion but she was overall feeling well and her valve criteria was on the borderline of moderate/severe so we elected to follow her aortic stenosis with serial imaging. Echo 04/12/24 with LVEF=65-70%. Mild to moderate MR. Severe aortic stenosis with mean gradient 62 mmHg, AVA 0.72 cm2, DI 0.25.   She is here today for follow up. She has been having progressive dyspnea with exertion and fatigue. She denies chest pain, palpitations, lower extremity edema, orthopnea, PND, dizziness, near syncope or syncope.   She lives in [place] with her husband who is being treated for throat cancer. She is retired as a radiographer, therapeutic. She has no active dental issues.   Primary Care Physician: Regino Slater, MD Primary Cardiologist: O'Neal Referring Cardiologist: O'Neal  Past Medical History:  Diagnosis Date   Aortic stenosis    Diabetes mellitus without complication (HCC) 2010   Diverticulitis 06/2015   High cholesterol 1987   Hypertension 2017   Obesity    Ureteral stone 06/2015    Past Surgical  History:  Procedure Laterality Date   BREAST BIOPSY Left 11/10/2005   benign   CESAREAN SECTION  1978   1982 second   LAPAROSCOPIC HYSTERECTOMY Bilateral 2008   TONSILECTOMY, ADENOIDECTOMY, BILATERAL MYRINGOTOMY AND TUBES  1976    Current Outpatient Medications  Medication Sig Dispense Refill   cholecalciferol (VITAMIN D3) 25 MCG (1000 UNIT) tablet Take 2,000 Units by mouth daily.     Evolocumab  (REPATHA  SURECLICK) 140 MG/ML SOAJ Inject 140 mg into the skin every 14 (fourteen) days. 6 mL 3   glimepiride (AMARYL) 1 MG tablet Take 0.5 mg by mouth every morning.     irbesartan (AVAPRO) 150 MG tablet Take 150 mg by mouth daily.     metFORMIN (GLUCOPHAGE) 1000 MG tablet Take 1,000 mg by mouth 2 (two) times daily with a meal.     rosuvastatin  (CRESTOR ) 20 MG tablet Take 1 tablet (20 mg total) by mouth at bedtime. 90 tablet 3   Current Facility-Administered Medications  Medication Dose Route Frequency Provider Last Rate Last Admin   Study - VICTORION-1 PREVENT - inclisiran 300 mg/1.5mL or placebo SQ injection (PI-Stuckey)  300 mg Subcutaneous Q6 months    300 mg at 03/29/23 0831    Allergies  Allergen Reactions   Penicillins Rash    Confirmed with patient no SOB, lip/tongue swelling    Social History   Socioeconomic History   Marital status: Married    Spouse name: Not on file   Number of children: 2   Years of education: Not on  file   Highest education level: Not on file  Occupational History   Occupation: Retired Engineer, Civil (consulting)  Tobacco Use   Smoking status: Former   Smokeless tobacco: Never  Advertising Account Planner   Vaping status: Never Used  Substance and Sexual Activity   Alcohol use: Yes    Alcohol/week: 2.0 standard drinks of alcohol    Types: 2 Glasses of wine per week    Comment: on the weekends   Drug use: No   Sexual activity: Not on file  Other Topics Concern   Not on file  Social History Narrative   Not on file   Social Drivers of Health   Tobacco Use: Medium  Risk (04/13/2024)   Patient History    Smoking Tobacco Use: Former    Smokeless Tobacco Use: Never    Passive Exposure: Not on Actuary Strain: Not on file  Food Insecurity: No Food Insecurity (02/12/2022)   Hunger Vital Sign    Worried About Running Out of Food in the Last Year: Never true    Ran Out of Food in the Last Year: Never true  Transportation Needs: No Transportation Needs (02/12/2022)   PRAPARE - Administrator, Civil Service (Medical): No    Lack of Transportation (Non-Medical): No  Physical Activity: Not on file  Stress: Not on file  Social Connections: Unknown (09/09/2021)   Received from [facility]   Social Network    Social Network: Not on file  Intimate Partner Violence: Unknown (08/01/2021)   Received from [facility]   HITS    Physically Hurt: Not on file    Insult or Talk Down To: Not on file    Threaten Physical Harm: Not on file    Scream or Curse: Not on file  Depression (EYV7-0): Not on file  Alcohol Screen: Not on file  Housing: Low Risk (02/12/2022)   Housing    Last Housing Risk Score: 0  Utilities: Not on file  Health Literacy: Not on file    Family History  Problem Relation Age of Onset   Hyperlipidemia Mother    High Cholesterol Mother    Diabetes Mother    Cancer Mother    Coronary artery disease Mother    Cancer Father    Hypertension Sister    Kidney disease Sister    Diabetes Sister     Review of Systems:  As stated in the HPI and otherwise negative.   BP 128/82   Pulse 81   Ht 4' 11.5 (1.511 m)   Wt 178 lb 12.8 oz (81.1 kg)   SpO2 97%   BMI 35.51 kg/m   Physical Examination: General: Well developed, well nourished, NAD  HEENT: OP clear, mucus membranes moist  SKIN: warm, dry. No rashes. Neuro: No focal deficits  Musculoskeletal: Muscle strength 5/5 all ext  Psychiatric: Mood and affect normal  Neck: No JVD, no carotid bruits, no thyromegaly, no lymphadenopathy.  Lungs:Clear  bilaterally, no wheezes, rhonci, crackles Cardiovascular: Regular rate and rhythm. Harsh systolic murmur.  Abdomen:Soft. Bowel sounds present. Non-tender.  Extremities: No lower extremity edema.   EKG:  EKG is ordered today. The ekg ordered today demonstrates  EKG Interpretation Date/Time:  Thursday April 13 2024 13:39:30 EST Ventricular Rate:  84 PR Interval:  142 QRS Duration:  70 QT Interval:  362 QTC Calculation: 427 R Axis:   -7  Text Interpretation: Normal sinus rhythm Left ventricular hypertrophy with repolarization abnormality ( R in aVL ) When compared  with ECG of 08-Oct-2023 14:59, No significant change was found Confirmed by Verlin Bruckner [phone]) on 04/13/2024 1:42:59 PM   Echo December 2025:   1. Left ventricular ejection fraction, by estimation, is 65 to 70%. Left  ventricular ejection fraction by 3D volume is 67 %. The left ventricle has  normal function. The left ventricle has no regional wall motion  abnormalities. Left ventricular diastolic   parameters are consistent with Grade I diastolic dysfunction (impaired  relaxation). The average left ventricular global longitudinal strain is  -19.8 %. The global longitudinal strain is normal.   2. Right ventricular systolic function is normal. The right ventricular  size is normal. There is normal pulmonary artery systolic pressure. The  estimated right ventricular systolic pressure is 35.3 mmHg.   3. The mitral valve is degenerative. Mild to moderate mitral valve  regurgitation.   4. The aortic valve is tricuspid. There is severe calcifcation of the  aortic valve. Aortic valve regurgitation is trivial. Severe aortic valve  stenosis. Aortic valve area, by VTI measures 0.77 cm. Aortic valve mean  gradient measures 62.0 mmHg. Aortic  valve Vmax measures 4.97 m/s. Peak gradient 98.8 mmHg, DI 0.25.   5. Aortic dilatation noted. There is borderline dilatation of the  ascending aorta, measuring 37 mm.   6. The  inferior vena cava is normal in size with greater than 50%  respiratory variability, suggesting right atrial pressure of 3 mmHg.   Comparison(s): Changes from prior study are noted. 09/28/2023: LVEF 65-70%,  Severe AS - MG 39 mmHg.   FINDINGS   Left Ventricle: Left ventricular ejection fraction, by estimation, is 65  to 70%. Left ventricular ejection fraction by 3D volume is 67 %. The left  ventricle has normal function. The left ventricle has no regional wall  motion abnormalities. The average  left ventricular global longitudinal strain is -19.8 %. Strain was  performed and the global longitudinal strain is normal. The left  ventricular internal cavity size was normal in size. There is no left  ventricular hypertrophy. Left ventricular diastolic  parameters are consistent with Grade I diastolic dysfunction (impaired  relaxation).   Right Ventricle: The right ventricular size is normal. No increase in  right ventricular wall thickness. Right ventricular systolic function is  normal. There is normal pulmonary artery systolic pressure. The tricuspid  regurgitant velocity is 2.84 m/s, and   with an assumed right atrial pressure of 3 mmHg, the estimated right  ventricular systolic pressure is 35.3 mmHg.   Left Atrium: Left atrial size was normal in size.   Right Atrium: Right atrial size was normal in size.   Pericardium: There is no evidence of pericardial effusion.   Mitral Valve: The mitral valve is degenerative in appearance. There is  mild calcification of the anterior and posterior mitral valve leaflet(s).  Mild to moderate mitral valve regurgitation, with centrally-directed jet.   Tricuspid Valve: The tricuspid valve is grossly normal. Tricuspid valve  regurgitation is trivial.   Aortic Valve: The aortic valve is tricuspid. There is severe calcifcation  of the aortic valve. Aortic valve regurgitation is trivial. Severe aortic  stenosis is present. Aortic valve mean gradient  measures 62.0 mmHg. Aortic  valve peak gradient measures  98.8 mmHg. Aortic valve area, by VTI measures 0.77 cm.   Pulmonic Valve: The pulmonic valve was grossly normal. Pulmonic valve  regurgitation is trivial.   Aorta: Aortic dilatation noted. There is borderline dilatation of the  ascending aorta, measuring 37 mm.   Venous:  The inferior vena cava is normal in size with greater than 50%  respiratory variability, suggesting right atrial pressure of 3 mmHg.   IAS/Shunts: No atrial level shunt detected by color flow Doppler.   Additional Comments: 3D was performed not requiring image post processing  on an independent workstation and was normal.     LEFT VENTRICLE  PLAX 2D  LVIDd:         4.30 cm  LVIDs:         2.90 cm         2D Longitudinal  LV PW:         1.00 cm         Strain  LV IVS:        1.00 cm         2D Strain GLS   -20.9 %  LVOT diam:     2.00 cm         (A4C):  LV SV:         98              2D Strain GLS   -19.8 %  LV SV Index:   56              (A3C):  LVOT Area:     3.14 cm        2D Strain GLS   -18.8 %                                 (A2C):                                 2D Strain GLS   -19.8 %                                 Avg:                                   3D Volume EF                                 LV 3D EF:    Left                                              ventricul                                              ar                                              ejection  fraction                                              by 3D                                              volume is                                              67 %.                                   3D Volume EF:                                 3D EF:        67 %                                 LV EDV:       111 ml                                 LV ESV:       37 ml                                 LV SV:        74 ml   RIGHT  VENTRICLE             IVC  RV Basal diam:  3.00 cm     IVC diam: 1.50 cm  RV Mid diam:    2.70 cm  RV S prime:     12.70 cm/s  PULMONARY VEINS  TAPSE (M-mode): 1.6 cm      A Reversal Velocity: 33.60 cm/s                              Diastolic Velocity:  62.70 cm/s                              S/D Velocity:        1.10                              Systolic Velocity:   69.90 cm/s   LEFT ATRIUM             Index        RIGHT ATRIUM           Index  LA diam:        4.00 cm 2.30 cm/m   RA Area:     14.10 cm  LA Vol (A2C):   49.3 ml 28.41 ml/m  RA  Volume:   33.50 ml  19.30 ml/m  LA Vol (A4C):   32.8 ml 18.90 ml/m  LA Biplane Vol: 42.4 ml 24.43 ml/m   AORTIC VALVE  AV Area (Vmax):    0.78 cm  AV Area (Vmean):   0.72 cm  AV Area (VTI):     0.77 cm  AV Vmax:           497.00 cm/s  AV Vmean:          355.500 cm/s  AV VTI:            1.270 m  AV Peak Grad:      98.8 mmHg  AV Mean Grad:      62.0 mmHg  LVOT Vmax:         124.00 cm/s  LVOT Vmean:        81.700 cm/s  LVOT VTI:          0.312 m  LVOT/AV VTI ratio: 0.25    AORTA  Ao Root diam: 2.30 cm  Ao Asc diam:  3.70 cm   MITRAL VALVE                  TRICUSPID VALVE  MV Area (PHT): 3.54 cm       TR Peak grad:   32.3 mmHg  MV Decel Time: 214 msec       TR Vmax:        284.00 cm/s  MR Peak grad:    137.4 mmHg  MR Mean grad:    87.0 mmHg    SHUNTS  MR Vmax:         586.00 cm/s  Systemic VTI:  0.31 m  MR Vmean:        441.0 cm/s   Systemic Diam: 2.00 cm  MR PISA:         1.57 cm  MR PISA Eff ROA: 9 mm  MR PISA Radius:  0.50 cm  MV E velocity: 109.00 cm/s  MV A velocity: 105.00 cm/s  MV E/A ratio:  1.04   Recent Labs: 10/13/2023: ALT 13    Wt Readings from Last 3 Encounters:  04/13/24 178 lb 12.8 oz (81.1 kg)  11/11/23 171 lb (77.6 kg)  10/08/23 175 lb 12.8 oz (79.7 kg)    Assessment and Plan:   1. Severe Aortic Valve Stenosis: She has severe aortic valve stenosis. NYHA class 2 symptoms. I have personally reviewed  the echo images. The aortic valve is thickened and calcified with limited leaflet mobility. I think she would benefit from AVR. She would be a candidate for conventional AVR by surgical approach or TAVR.    I have reviewed the natural history of aortic stenosis with the patient and their family members  who are present today. We have discussed the limitations of medical therapy and the poor prognosis associated with symptomatic aortic stenosis. We have reviewed potential treatment options, including palliative medical therapy, conventional surgical aortic valve replacement, and transcatheter aortic valve replacement. We discussed treatment options in the context of the patient's specific comorbid medical conditions.   She would like to proceed with planning for TAVR. I will arrange a cardiac catheterization at [facility]  05/11/23 at 11:30am. Risks and benefits of the cath procedure and the valve procedure are reviewed with the patient. After the cath, she will have a cardiac CT, CTA of the chest/abdomen and pelvis and will then be referred to see one of the CT surgeons on our TAVR team.   -  BMET and CBC today    Labs/ tests ordered today include:  Orders Placed This Encounter  Procedures   CBC   Basic Metabolic Panel (BMET)   EKG 12-Lead   Disposition:   F/U with the structural heart team  Signed, Lonni Cash, MD, [facility] 04/13/2024 2:27 PM    [facility] [address] [place], [place], [place]  [address] Phone: [phone]; Fax: [phone]

## 2024-04-13 NOTE — Patient Instructions (Addendum)
 Medication Instructions:  Your physician recommends that you continue on your current medications as directed. Please refer to the Current Medication list given to you today.  *If you need a refill on your cardiac medications before your next appointment, please call your pharmacy*  Lab Work: Have lab work (CBC and BMP) drawn today in the lab on the first floor If you have labs (blood work) drawn today and your tests are completely normal, you will receive your results only by: MyChart Message (if you have MyChart) OR A paper copy in the mail If you have any lab test that is abnormal or we need to change your treatment, we will call you to review the results.  Testing/Procedures: Your physician has requested that you have a cardiac catheterization. Cardiac catheterization is used to diagnose and/or treat various heart conditions. Doctors may recommend this procedure for a number of different reasons. The most common reason is to evaluate chest pain. Chest pain can be a symptom of coronary artery disease (CAD), and cardiac catheterization can show whether plaque is narrowing or blocking your hearts arteries. This procedure is also used to evaluate the valves, as well as measure the blood flow and oxygen levels in different parts of your heart. For further information please visit [email or web address] Please follow instruction sheet, as given. Scheduled for 05/10/24  Follow-Up: At [facility], you and your health needs are our priority.  As part of our continuing mission to provide you with exceptional heart care, our providers are all part of one team.  This team includes your primary Cardiologist (physician) and Advanced Practice Providers or APPs (Physician Assistants and Nurse Practitioners) who all work together to provide you with the care you need, when you need it.  Your next appointment:   To be determined after procedure  Provider:   Dr Verlin   We recommend signing up  for the patient portal called MyChart.  Sign up information is provided on this After Visit Summary.  MyChart is used to connect with patients for Virtual Visits (Telemedicine).  Patients are able to view lab/test results, encounter notes, upcoming appointments, etc.  Non-urgent messages can be sent to your provider as well.   To learn more about what you can do with MyChart, go to [email or web address].   Other Instructions    [place] [facility] A DEPT OF [place]. [facility] AT [address] A DEPT OF THE [place]. [facility] [address] [place] [address] Dept: [phone] Loc: [phone]  [name]  04/13/2024  You are scheduled for a Cardiac Catheterization on Wednesday, January 14 with Dr. Lonni Verlin.  1. Please arrive at the [facility] (Main Entrance A) at [facility]: [address] [place], [place] [address] at 9:30 AM (This time is 2 hour(s) before your procedure to ensure your preparation).   Free valet parking service is available. You will check in at ADMITTING. The support person will be asked to wait in the waiting room.  It is OK to have someone drop you off and come back when you are ready to be discharged.    Special note: Every effort is made to have your procedure done on time. Please understand that emergencies sometimes delay scheduled procedures.  2. Diet: Nothing to eat after midnight.   3. Hydration: You need to be well hydrated before your procedure. On January 14, you may drink approved liquids (see below) until 2 hours before the procedure, with  16 oz of water as your last intake.   List of approved liquids water, clear juice, clear tea, black coffee, fruit juices, non-citric and without pulp, carbonated beverages, Gatorade, Kool -Aid, plain Jello-O and plain ice popsicles.  4. Labs: done on 04/13/24  5. Medication instructions in preparation for your procedure:  Do not take any diabetes  medication the morning of the procedure Do not take metformin the morning of the procedure and for 48 hours after procedure   Contrast Allergy: No   On the morning of your procedure, take your Aspirin 81 mg and any morning medicines NOT listed above.  You may use sips of water.  6. Plan to go home the same day, you will only stay overnight if medically necessary. 7. Bring a current list of your medications and current insurance cards. 8. You MUST have a responsible person to drive you home. 9. Someone MUST be with you the first 24 hours after you arrive home or your discharge will be delayed. 10. Please wear clothes that are easy to get on and off and wear slip-on shoes.  Thank you for allowing us  to care for you!   -- [place] [facility]

## 2024-04-14 ENCOUNTER — Ambulatory Visit: Payer: Self-pay | Admitting: Cardiovascular Disease

## 2024-04-14 ENCOUNTER — Ambulatory Visit: Admitting: Physical Therapy

## 2024-04-14 LAB — BASIC METABOLIC PANEL WITH GFR
BUN/Creatinine Ratio: 26 (ref 12–28)
BUN: 19 mg/dL (ref 8–27)
CO2: 20 mmol/L (ref 20–29)
Calcium: 10.8 mg/dL — AB (ref 8.7–10.3)
Chloride: 105 mmol/L (ref 96–106)
Creatinine, Ser: 0.72 mg/dL (ref 0.57–1.00)
Glucose: 99 mg/dL (ref 70–99)
Potassium: 4.6 mmol/L (ref 3.5–5.2)
Sodium: 145 mmol/L — AB (ref 134–144)
eGFR: 92 mL/min/1.73

## 2024-04-14 LAB — CBC
Hematocrit: 37.3 % (ref 34.0–46.6)
Hemoglobin: 11.9 g/dL (ref 11.1–15.9)
MCH: 28.2 pg (ref 26.6–33.0)
MCHC: 31.9 g/dL (ref 31.5–35.7)
MCV: 88 fL (ref 79–97)
Platelets: 294 x10E3/uL (ref 150–450)
RBC: 4.22 x10E6/uL (ref 3.77–5.28)
RDW: 13.6 % (ref 11.7–15.4)
WBC: 11.4 x10E3/uL — ABNORMAL HIGH (ref 3.4–10.8)

## 2024-04-18 ENCOUNTER — Ambulatory Visit: Admitting: Physical Therapy

## 2024-04-25 ENCOUNTER — Encounter: Payer: Self-pay | Admitting: Physical Therapy

## 2024-04-25 ENCOUNTER — Ambulatory Visit: Admitting: Physical Therapy

## 2024-04-25 DIAGNOSIS — M25511 Pain in right shoulder: Secondary | ICD-10-CM

## 2024-04-25 DIAGNOSIS — M25611 Stiffness of right shoulder, not elsewhere classified: Secondary | ICD-10-CM

## 2024-04-25 DIAGNOSIS — M6281 Muscle weakness (generalized): Secondary | ICD-10-CM

## 2024-04-25 DIAGNOSIS — M25512 Pain in left shoulder: Secondary | ICD-10-CM

## 2024-04-25 NOTE — Therapy (Signed)
 " OUTPATIENT PHYSICAL THERAPY SHOULDER PROGRESS NOTE/DISCHARGE SUMMARY   Patient Name: [name] MRN: [id number] DOB:09-Nov-1955, 68 y.o., female Today's Date: 04/25/2024  END OF SESSION:  PT End of Session - 04/25/24 0914     Visit Number 5    Date for Recertification  04/25/24    Authorization Type [employer] Medicare 8 visits 11/4-12/30; [employer] questionaire too    Authorization - Visit Number 5    Authorization - Number of Visits 8    Progress Note Due on Visit 10    PT Start Time 0915    PT Stop Time 945 discharge   PT Time Calculation (min) 30 min    Activity Tolerance Patient tolerated treatment well           Past Medical History:  Diagnosis Date   Aortic stenosis    Diabetes mellitus without complication (HCC) 2010   Diverticulitis 06/2015   High cholesterol 1987   Hypertension 2017   Obesity    Ureteral stone 06/2015   Past Surgical History:  Procedure Laterality Date   BREAST BIOPSY Left 11/10/2005   benign   CESAREAN SECTION  1978   1982 second   LAPAROSCOPIC HYSTERECTOMY Bilateral 2008   TONSILECTOMY, ADENOIDECTOMY, BILATERAL MYRINGOTOMY AND TUBES  1976   Patient Active Problem List   Diagnosis Date Noted   Abdominal pain 06/30/2015   Diverticulitis 06/30/2015   Ureteral stone 06/30/2015   Hydronephrosis 06/30/2015   Lower urinary tract infectious disease 06/30/2015   Acute kidney injury 06/30/2015   Diabetes mellitus without complication (HCC)    High cholesterol    Obesity    Acute diverticulitis     PCP: Regino Dibas MD  REFERRING PROVIDER: Regino Dibas MD  REFERRING DIAG: M25.511 right shoulder pain  THERAPY DIAG:  Right shoulder pain; right shoulder stiffness; weakness; left shoulder pain  Rationale for Evaluation and Treatment: Rehabilitation  ONSET DATE: last few months  SUBJECTIVE:                                                                                                                                                                                       SUBJECTIVE STATEMENT: To be fair I haven't done the ex's.  Having heart cath in a few weeks and then I'll need to have valve fixed.  The left arm goes to sleep only if I leave it out to the side too long.  Sleeping fine if I position my arm against my chest.     Eval: I went to the doctor's b/c I had right breast pain.  He wasn't happy with my lack of motion right shoulder. No shoulder pain but rather  upper arm pain and difficulty moving her arm. Left arm as well as right arm.   I think this happened 1 year ago bc of cholesterol medicine. Has been helping with husband's feeding tube but I don't think that caused this (throat cancer)   Has 2 Lab dogs Has home TENS Sews Has home gym with Bowflex, kettlebells, foam roll Hand dominance: Right  PERTINENT HISTORY: DM; HTN; aortic valve stenosis (being watched); right knee OA (scoped)  PAIN:   Are you having pain? Yes NPRS scale: 0/10 Pain location:  right shoulder; left upper arm only with sitting too long Pain orientation: Bilateral  PAIN TYPE: aching Pain description: intermittent  Aggravating factors: right sidelying with arm bent; leaning on elbow, feel on left side with petting the dogs prolonged abduction; feel it with taking off a pullover shirt Relieving factors: no pain at rest; doesn't hurt to carry things or vacumn PRECAUTIONS: None  WEIGHT BEARING RESTRICTIONS: No  FALLS:  Has patient fallen in last 6 months? No  PLOF: Independent  PATIENT GOALS:find out why this happening  NEXT MD VISIT:   OBJECTIVE:  Note: Objective measures were completed at Evaluation unless otherwise noted.  DIAGNOSTIC FINDINGS:  none  PATIENT SURVEYS:  Quick Dash:  QUICK DASH  Please rate your ability do the following activities in the last week by selecting the number below the appropriate response.   Activities Rating 12/30  Open a tight or new jar.  3 = Moderate difficulty   Do heavy household  chores (e.g., wash walls, floors). 1 = No difficulty    Carry a shopping bag or briefcase 1 = No difficulty    Wash your back. 1 = No difficulty    Use a knife to cut food. 1 = No difficulty    Recreational activities in which you take some force or impact through your arm, shoulder or hand (e.g., golf, hammering, tennis, etc.). 1 = No difficulty    During the past week, to what extent has your arm, shoulder or hand problem interfered with your normal social activities with family, friends, neighbors or groups?  1 = Not at all   During the past week, were you limited in your work or other regular daily activities as a result of your arm, shoulder or hand problem? 1 = Not limited at all   Rate the severity of the following symptoms in the last week: Arm, Shoulder, or hand pain. 1 = none   Rate the severity of the following symptoms in the last week: Tingling (pins and needles) in your arm, shoulder or hand. 1 = none   During the past week, how much difficulty have you had sleeping because of the pain in your arm, shoulder or hand?  3 = Moderate difficulty    (A QuickDASH score may not be calculated if there is greater than 1 missing item.)  Quick Dash Disability/Symptom Score: 9.1%, 12/30: 2.3%  Minimally Clinically Important Difference (MCID): 15-20 points  Flavio, F. et al. (2013). Minimally clinically important difference of the disabilities of the arm, shoulder, and hand outcome measures (DASH) and its shortened version (Quick DASH). Journal of Orthopaedic & Sports Physical Therapy, 44(1), 30-39)   COGNITION: Overall cognitive status: Within functional limits for tasks assessed     POSTURE: Forward shoulders  UPPER EXTREMITY ROM:   Active ROM Right eval Left eval 12/3 R 12/3 L 12/30  Shoulder flexion 143 pulls upper arm 149 R 154 L 152 L 151 R 148  Shoulder extension       Shoulder abduction 128 mild pull 148 R 153  L 156 L 153 R 152  Shoulder adduction       Shoulder  internal rotation L1 sore T8 R Thumb to T9 L T8 L T6 R T10  Shoulder external rotation 77 sore/C7 82/T1 R 77 L 81  L 78 R  78  Elbow flexion       Elbow extension       Wrist flexion       Wrist extension       Wrist ulnar deviation       Wrist radial deviation       Wrist pronation       Wrist supination       (Blank rows = not tested)  UPPER EXTREMITY MMT:  MMT Right eval Left eval 12/30  Shoulder flexion 4 4+ 4+ Bil  Shoulder extension     Shoulder abduction 4 4+ 4+ Bil  Shoulder adduction     Shoulder internal rotation 4 4+ 4+ Bil   Shoulder external rotation 4 4+ 4+ Bil  Middle trapezius 4- 4 4+ Bil  Lower trapezius 4- 4 4+ bil   Elbow flexion     Elbow extension     Wrist flexion     Wrist extension     Wrist ulnar deviation     Wrist radial deviation     Wrist pronation     Wrist supination     Grip strength (lbs)     (Blank rows = not tested)  SHOULDER SPECIAL TESTS: Negative drop arm; negative Vonzell Berber, negative active compression test   JOINT MOBILITY TESTING:  Right > left glenohumeral hypomobility in all directions; decreased thoracic extension mobility  PALPATION:  Decreased bil pectoral lengths                                                                                                                             TREATMENT DATE:  04/25/24 Shoulder ROM Quick DASH Review of HEP Discussed her plan for after valve replacement  03/29/24 UBE 3 min while discussing status and progress Shoulder ROM review and areas of emphasis  Supine serratus punch 5# x20 right/left Seated bicep curls 5# 10x right/left Counter push ups 10x  3 way raises with 2# wts 1x10  Standing Lat pull 30# x 20  Review of thoracic extension with foam roll before, during or after sewing Review of stretching: little bits often throughout the day  resistive ex's with bands or weights 3-4x/week  03/15/24 Seated thoracic extension with ball 10x Standing counter force  Pulleys 3#:  flexion, scaption, abduction 10x right/left (pt states she can replicate with her Bowflex at home) (Added to HEP- see below) Supine serratus punch 5# 2x10 (Added to HEP- see below) 3 way raises with 2# wts 1x10 (Added to HEP- see below) Standing Lat pull 30# x 20 (Added to HEP- see below) Review of  stretching: pt likes doorway stretch best Pt would like to do HEP for 2 weeks  and then follow up  Discussed daily stretching and resistive ex's with bands or weights 3-4x/week     03/08/24 Pulleys flex x 2 min,  IR x 1 min (not much stretch) Reviewed: Standing wall slide, Seated scap retraction, seated thoracic ext with pec stretch 3 way raises with 2# wts 1x10 Standing B ER with green band 2x10 Row with blue band x 20 Shoulder ext blue band x 20 Horizontal ABD green x 20 Lat pull 30# x 20 IR green x20 in door ER green x 20 in door 3 way reach with blue loop x 5 B   PATIENT EDUCATION: Education details: Educated patient on anatomy and physiology of current symptoms, prognosis, plan of care as well as initial self care strategies to promote recovery Person educated: Patient Education method: Explanation Education comprehension: verbalized understanding  HOME EXERCISE PROGRAM: Access Code: [email or web address] URL: [email or web address] Date: 03/15/2024 Prepared by: Glade Pesa  Exercises - Supine Shoulder Flexion Overhead with Dowel (Mirrored)  - 1 x daily - 7 x weekly - 1 sets - 3 reps - 30 hold - Seated Shoulder Flexion Slide at Table Top with Forearm in Neutral  - 1 x daily - 7 x weekly - 1 sets - 3 reps - 30 hold - Standing Single Shoulder Flexion Wall Slide with Palm Up  - 1 x daily - 7 x weekly - 3 sets - 3 reps - 30 hold - Seated Scapular Retraction  - 1 x daily - 7 x weekly - 1 sets - 10 reps - Seated Thoracic Lumbar Extension with Pectoralis Stretch  - 1 x daily - 7 x weekly - 1 sets - 10 reps - Shoulder External Rotation with Anchored Resistance  - 1 x  daily - 3 x weekly - 1-3 sets - 10 reps - Shoulder Internal Rotation with Resistance  - 1 x daily - 3 x weekly - 1-3 sets - 10 reps - Standing Shoulder Row with Anchored Resistance  - 1 x daily - 3 x weekly - 1-3 sets - 10 reps - Shoulder extension with resistance - Neutral  - 1 x daily - 3 x weekly - 2 sets - 10 reps - Standing Shoulder Horizontal Abduction with Resistance  - 1 x daily - 3 x weekly - 2 sets - 10 reps - Standing Plank on Wall with Reaches and Resistance  - 1 x daily - 3 x weekly - 2 sets - 10 reps - Standing Shoulder Flexion to 90 Degrees  - 1 x daily - 3 x weekly - 1-3 sets - 10 reps - 3 sec hold - Standing Shoulder Scaption  - 1 x daily - 3 x weekly - 1-3 sets - 10 reps - 3 sec hold - Shoulder Abduction - Thumbs Up  - 1 x daily - 3 x weekly - 1-3 sets - 10 reps - 3 sec hold - Standing Shoulder Flexion AAROM with Pulley in Front  - 1 x daily - 7 x weekly - 3 sets - 10 reps - Supine Scapular Protraction in Flexion with Dumbbells  - 1 x daily - 7 x weekly - 2 sets - 10 reps - Standing Shoulder Flexion to 90 Degrees with Dumbbells  - 1 x daily - 7 x weekly - 1 sets - 10 reps - Scaption with Dumbbells  - 1 x daily - 7 x weekly - 1 sets - 10 reps -  Shoulder Abduction with Dumbbells - Thumbs Up  - 1 x daily - 7 x weekly - 10 reps - Lat Pull Down  - 1 x daily - 7 x weekly - 2 sets - 10 reps - Standing Lat Pull Down with Resistance - Elbows Bent  - 1 x daily - 7 x weekly - 1 sets - 10 reps ASSESSMENT:  CLINICAL IMPRESSION: The patient has met the majority of rehab goals, with noted improvements in pain reduction, outcome score, ROM, strength and functional mobility.  A comprehensive HEP has been established and anticipate further improvements over time with regular performance of the program.  Recommend discharge from PT at this time.    OBJECTIVE IMPAIRMENTS: decreased activity tolerance, decreased ROM, decreased strength, hypomobility, impaired perceived functional ability, and  pain.   ACTIVITY LIMITATIONS: sleeping, dressing, and caring for others  PARTICIPATION LIMITATIONS: taking care of dogs  PERSONAL FACTORS: Age and 1 comorbidity: diabetes are also affecting patient's functional outcome.   REHAB POTENTIAL: Good  CLINICAL DECISION MAKING: Stable/uncomplicated  EVALUATION COMPLEXITY: Low   GOALS: Goals reviewed with patient? Yes  SHORT TERM GOALS: Target date: 03/28/2024    The patient will demonstrate knowledge of basic self care strategies and exercises to promote healing  Baseline: Goal status: met 11/19  2.  The patient will have improved shoulder elevation ROM to at least 147 degrees needed for grooming/dressing purposes as well as reaching high shelves  Baseline:  Goal status:met 12/3  3.  Shoulder internal rotation to T10 needed for dressing tasks Baseline:  Goal status: met 12/3    LONG TERM GOALS: Target date: 04/25/2024    The patient will be independent in a safe self progression of a home exercise program to promote further recovery of function  Baseline:  Goal status: MET 12/30  2.  The patient will report a 60% improvement in pain levels with functional activities which are currently difficult including sleeping on her sides, petting the dogs, taking on/off a pullover shirt Baseline:  Goal status: pt states she's been modifying for years  3.  The patient will have improved shoulder elevation ROM to at least 153 degrees needed for grooming/dressing purposes as well as reaching high shelves  Baseline:  Goal status: met 12/30  4.  Shoulder internal rotation to 70 degrees needed for dressing tasks Baseline:  Goal status: met 12/3                   PLAN: PHYSICAL THERAPY DISCHARGE SUMMARY  Visits from Start of Care: 5  Current functional level related to goals / functional outcomes: See clinical impressions above   Remaining deficits: As above   Education / Equipment: HEP   Patient agrees to discharge.  Patient goals were met. Patient is being discharged due to meeting the stated rehab goals.  Glade Pesa, PT 04/25/2024 9:49 AM Phone: [phone] Fax: [phone]           "

## 2024-05-01 ENCOUNTER — Encounter: Payer: Self-pay | Admitting: Cardiovascular Disease

## 2024-05-04 ENCOUNTER — Encounter: Payer: Self-pay | Admitting: Cardiovascular Disease

## 2024-05-09 ENCOUNTER — Telehealth: Payer: Self-pay | Admitting: *Deleted

## 2024-05-09 NOTE — Telephone Encounter (Signed)
 Cardiac Catheterization scheduled at [facility] for: Wednesday May 11, 2024 11:30 AM Arrival time [facility] Main Entrance A at: 9:30 AM  Diet: -Nothing to eat after midnight.  Hydration: -May drink clear liquids until 2 hours before the procedure.  Approved liquids: Water , clear tea, black coffee, fruit juices-non-citric and without pulp,Gatorade, plain Jello/popsicles.   -Please drink 16 oz of water  2 hours before procedure.  Medication instructions: -Hold:  Metformin-day of procedure and 48 hours after procedure  Glimepiride-AM of procedure  -Other usual morning medications can be taken including aspirin  81 mg.  Plan to go home the same day, you will only stay overnight if medically necessary.  You must have responsible adult to drive you home.  Someone must be with you the first 24 hours after you arrive home.  Reviewed procedure instructions with patient.

## 2024-05-10 ENCOUNTER — Encounter (HOSPITAL_COMMUNITY)
Admission: RE | Disposition: A | Discharge status: Home / Self Care | Payer: Self-pay | Source: Home / Self Care | Attending: Cardiovascular Disease

## 2024-05-10 ENCOUNTER — Ambulatory Visit (HOSPITAL_COMMUNITY)
Admission: RE | Admit: 2024-05-10 | Discharge: 2024-05-10 | Disposition: A | Discharge status: Home / Self Care | Attending: Cardiovascular Disease | Admitting: Cardiovascular Disease

## 2024-05-10 ENCOUNTER — Other Ambulatory Visit: Payer: Self-pay

## 2024-05-10 DIAGNOSIS — I350 Nonrheumatic aortic (valve) stenosis: Secondary | ICD-10-CM

## 2024-05-10 DIAGNOSIS — R5383 Other fatigue: Secondary | ICD-10-CM | POA: Diagnosis not present

## 2024-05-10 DIAGNOSIS — Z87891 Personal history of nicotine dependence: Secondary | ICD-10-CM | POA: Insufficient documentation

## 2024-05-10 DIAGNOSIS — R0609 Other forms of dyspnea: Secondary | ICD-10-CM | POA: Diagnosis not present

## 2024-05-10 HISTORY — PX: LEFT HEART CATH AND CORONARY ANGIOGRAPHY: CATH118249

## 2024-05-10 LAB — GLUCOSE, CAPILLARY: Glucose-Capillary: 156 mg/dL — ABNORMAL HIGH (ref 70–99)

## 2024-05-10 MED ORDER — FENTANYL CITRATE (PF) 100 MCG/2ML IJ SOLN
INTRAMUSCULAR | Status: AC
  Filled 2024-05-10: qty 2

## 2024-05-10 MED ORDER — FREE WATER: 500.0000 mL | Freq: Once | Status: DC

## 2024-05-10 MED ORDER — SODIUM CHLORIDE 0.9% FLUSH: 3.0000 mL | INTRAVENOUS | Status: DC | PRN

## 2024-05-10 MED ORDER — LABETALOL HCL 5 MG/ML IV SOLN: 10.0000 mg | INTRAVENOUS | Status: DC | PRN

## 2024-05-10 MED ORDER — MIDAZOLAM HCL 2 MG/2ML IJ SOLN
INTRAMUSCULAR | Status: AC
  Filled 2024-05-10: qty 2

## 2024-05-10 MED ORDER — VERAPAMIL HCL 2.5 MG/ML IV SOLN
INTRAVENOUS | Status: DC | PRN
  Administered 2024-05-10: 10 mL via INTRA_ARTERIAL

## 2024-05-10 MED ORDER — SODIUM CHLORIDE 0.9 % IV SOLN: 250.0000 mL | INTRAVENOUS | Status: DC | PRN

## 2024-05-10 MED ORDER — ASPIRIN 81 MG PO CHEW: 81.0000 mg | CHEWABLE_TABLET | ORAL | Status: DC

## 2024-05-10 MED ORDER — HEPARIN SODIUM (PORCINE) 1000 UNIT/ML IJ SOLN
INTRAMUSCULAR | Status: AC
  Filled 2024-05-10: qty 10

## 2024-05-10 MED ORDER — ONDANSETRON HCL 4 MG/2ML IJ SOLN: 4.0000 mg | Freq: Four times a day (QID) | INTRAMUSCULAR | Status: DC | PRN

## 2024-05-10 MED ORDER — HYDRALAZINE HCL 20 MG/ML IJ SOLN: 10.0000 mg | INTRAMUSCULAR | Status: DC | PRN

## 2024-05-10 MED ORDER — SODIUM CHLORIDE 0.9% FLUSH: 3.0000 mL | Freq: Two times a day (BID) | INTRAVENOUS | Status: DC

## 2024-05-10 MED ORDER — MIDAZOLAM HCL (PF) 2 MG/2ML IJ SOLN
INTRAMUSCULAR | Status: DC | PRN
  Administered 2024-05-10: 1 mg via INTRAVENOUS

## 2024-05-10 MED ORDER — HEPARIN (PORCINE) IN NACL 1000-0.9 UT/500ML-% IV SOLN
INTRAVENOUS | Status: DC | PRN
  Administered 2024-05-10 (×2): 500 mL

## 2024-05-10 MED ORDER — LIDOCAINE HCL (PF) 1 % IJ SOLN
INTRAMUSCULAR | Status: DC | PRN
  Administered 2024-05-10: 2 mL via INTRADERMAL

## 2024-05-10 MED ORDER — ACETAMINOPHEN 325 MG PO TABS: 650.0000 mg | ORAL_TABLET | ORAL | Status: DC | PRN

## 2024-05-10 MED ORDER — LIDOCAINE HCL (PF) 1 % IJ SOLN
INTRAMUSCULAR | Status: AC
  Filled 2024-05-10: qty 30

## 2024-05-10 MED ORDER — HEPARIN SODIUM (PORCINE) 1000 UNIT/ML IJ SOLN
INTRAMUSCULAR | Status: DC | PRN
  Administered 2024-05-10: 4000 [IU] via INTRAVENOUS

## 2024-05-10 MED ORDER — IOHEXOL 350 MG/ML SOLN
INTRAVENOUS | Status: DC | PRN
  Administered 2024-05-10: 40 mL

## 2024-05-10 MED ORDER — VERAPAMIL HCL 2.5 MG/ML IV SOLN
INTRAVENOUS | Status: AC
  Filled 2024-05-10: qty 2

## 2024-05-10 MED ORDER — METOPROLOL TARTRATE 50 MG PO TABS: 50.0000 mg | ORAL_TABLET | Freq: Once | ORAL | 0 refills | Status: DC

## 2024-05-10 MED ORDER — FENTANYL CITRATE (PF) 100 MCG/2ML IJ SOLN
INTRAMUSCULAR | Status: DC | PRN
  Administered 2024-05-10: 25 ug via INTRAVENOUS

## 2024-05-10 NOTE — Progress Notes (Signed)
 Discharge instructions reviewed with patient and spouse at bedside. Denies questions concerns. PT tolerated PO intake. Incision site remains clean dry and intact. No s/s of complications. PT escorted from the unit via wheel chair to personal vehicle.

## 2024-05-10 NOTE — Addendum Note (Signed)
 Addended by: IZETTA CONCETTA KIDD on: 05/10/2024 01:34 PM   Modules accepted: Orders

## 2024-05-10 NOTE — Interval H&P Note (Signed)
 History and Physical Interval Note:  05/10/2024 12:27 PM  [name]  has presented today for surgery, with the diagnosis of aortic stenosis.  The various methods of treatment have been discussed with the patient and family. After consideration of risks, benefits and other options for treatment, the patient has consented to  Procedures: LEFT HEART CATH AND CORONARY ANGIOGRAPHY (N/A) as a surgical intervention.  The patient's history has been reviewed, patient examined, no change in status, stable for surgery.  I have reviewed the patient's chart and labs.  Questions were answered to the patient's satisfaction.    Cath Lab Visit (complete for each Cath Lab visit)  Clinical Evaluation Leading to the Procedure:   ACS: No.  Non-ACS:    Anginal Classification: No Symptoms  Anti-ischemic medical therapy: No Therapy  Non-Invasive Test Results: No non-invasive testing performed  Prior CABG: No previous CABG        Lonni Cash

## 2024-05-10 NOTE — Discharge Instructions (Addendum)
 Radial Site Care The following information offers guidance on how to care for yourself after your procedure. Your health care provider may also give you more specific instructions. If you have problems or questions, contact your health care provider. What can I expect after the procedure? After the procedure, it is common to have bruising and tenderness in the incision area. Follow these instructions at home: Incision site care  Follow instructions from your health care provider about how to take care of your incision site. Make sure you: Wash your hands with soap and water  for at least 20 seconds before and after you change your bandage (dressing). If soap and water  are not available, use hand sanitizer. Remove your dressing in 24 hours. Leave stitches (sutures), skin glue, or adhesive strips in place. These skin closures may need to stay in place for 2 weeks or longer. If adhesive strip edges start to loosen and curl up, you may trim the loose edges. Do not remove adhesive strips completely unless your health care provider tells you to do that. Do not take baths, swim, or use a hot tub for at least 1 week. You may shower 24 hours after the procedure or as told by your health care provider. Remove the dressing and gently wash the incision area with plain soap and water . Pat the area dry with a clean towel. Do not rub the site. That could cause bleeding. Do not apply powder or lotion to the site. Check your incision site every day for signs of infection. Check for: Redness, swelling, or pain. Fluid or blood. Warmth. Pus or a bad smell. Activity For 24 hours after the procedure, or as directed by your health care provider: Do not flex or bend the affected arm. Do not push or pull heavy objects with the affected arm. Do not operate machinery or power tools. Do not drive. You should not drive yourself home from the hospital or clinic if you go home during that time period. You may drive 24  hours after the procedure unless your health care provider tells you not to. Do not lift anything that is heavier than 10 lb (4.5 kg), or the limit that you are told, until your health care provider says that it is safe. Return to your normal activities as told by your health care provider. Ask your health care provider what activities are safe for you and when you can return to work. If you were given a sedative during the procedure, it can affect you for several hours. Do not drive or operate machinery until your health care provider says that it is safe. General instructions Take over-the-counter and prescription medicines only as told by your health care provider. If you will be going home right after the procedure, plan to have a responsible adult care for you for the time you are told. This is important. Keep all follow-up visits. This is important. Contact a health care provider if: You have a fever or chills. You have any of these signs of infection at your incision site: Redness, swelling, or pain. Fluid or blood. Warmth. Pus or a bad smell. Get help right away if: The incision area swells very fast. The incision area is bleeding, and the bleeding does not stop when you hold steady pressure on the area. Your arm or hand becomes pale, cool, tingly, or numb. These symptoms may represent a serious problem that is an emergency. Do not wait to see if the symptoms will go away. Get medical  help right away. Call your local emergency services (911 in the [place] Do not drive yourself to the hospital. Hold metformin for 48 hours post cath  Summary After the procedure, it is common to have bruising and tenderness at the incision site. Follow instructions from your health care provider about how to take care of your radial site incision. Check the incision every day for signs of infection. Do not lift anything that is heavier than 10 lb (4.5 kg), or the limit that you are told, until your  health care provider says that it is safe. Get help right away if the incision area swells very fast, you have bleeding at the incision site that will not stop, or your arm or hand becomes pale, cool, or numb. This information is not intended to replace advice given to you by your health care provider. Make sure you discuss any questions you have with your health care provider. Document Revised: 06/02/2020 Document Reviewed: 06/02/2020 Elsevier Patient Education  2024 [employer].

## 2024-05-11 ENCOUNTER — Encounter (HOSPITAL_COMMUNITY): Payer: Self-pay | Admitting: Cardiovascular Disease

## 2024-05-25 ENCOUNTER — Ambulatory Visit (HOSPITAL_COMMUNITY)
Admission: RE | Admit: 2024-05-25 | Discharge: 2024-05-25 | Disposition: A | Discharge status: Home / Self Care | Attending: Cardiology | Admitting: Cardiology

## 2024-05-25 DIAGNOSIS — I350 Nonrheumatic aortic (valve) stenosis: Secondary | ICD-10-CM | POA: Diagnosis present

## 2024-05-25 MED ORDER — IOHEXOL 350 MG/ML SOLN
100.0000 mL | Freq: Once | INTRAVENOUS | Status: AC | PRN
  Administered 2024-05-25: 100 mL via INTRAVENOUS

## 2024-05-25 NOTE — Progress Notes (Signed)
 Procedure Type: Isolated AVR Perioperative Outcome Estimate % Operative Mortality 2.71% Morbidity & Mortality 10.3% Stroke 1.42% Renal Failure 2.12% Reoperation 2.84% Prolonged Ventilation 5.78% Deep Sternal Wound Infection 0.098% Long Hospital Stay (>14 days) 4.37% Short Hospital Stay (<6 days)* 44.2%

## 2024-05-28 ENCOUNTER — Ambulatory Visit: Payer: Self-pay | Admitting: Cardiovascular Disease

## 2024-06-01 ENCOUNTER — Ambulatory Visit

## 2024-06-02 ENCOUNTER — Institutional Professional Consult (permissible substitution)

## 2024-06-02 VITALS — BP 169/88 | HR 73 | Resp 18 | Ht 60.0 in

## 2024-06-02 DIAGNOSIS — I350 Nonrheumatic aortic (valve) stenosis: Secondary | ICD-10-CM | POA: Insufficient documentation

## 2024-06-02 NOTE — Progress Notes (Unsigned)
 "   [address] [place] [address]             [phone]    [name] [facility] Medical Record #[id number] Date of Birth: [birth year]  Referring: Verlin Lonni BIRCH, MD Primary Care: Regino Slater, MD Primary Cardiologist:[place] ONEIDA Decent, MD  Chief Complaint:    Chief Complaint  Patient presents with   Aortic Stenosis    New patient consult, review all studies    History of Present Illness:     [name] is a 69 y.o. female who presents for surgical evaluation of critical aortic stenosis.    Reports shortness of breath walking up stairs, carrying things, helping outside with the yard.  This has gotten progressively worse.  Had some right sided chest pain but nothing substernal.  Denies syncope, dizziness, leg swelling, occasional palpitations.    Former smoker (smoked for 7-8 years x 1 PPD 40 years ago).  Drinks 4 drinks of acohol a week.    LHC (05/10/24): No significant coronary artery disease TTE (04/12/24): Normal biventricular function. Mild to moderate MR, critical AS - MG 62, V max 4.97, AVA 0.77 Chest CT (05/25/24): Some calcium  at the STJ Annulus: Area 308, dia 19.8 mm, peri 64.2 mm Max ascending aorta diameter: 35.6 cm PFT (***): ***  Past Medical and Surgical History: Previous Chest Surgery: No Previous Chest Radiation: No Diabetes Mellitus: Yes.  HbA1C 6.9% (01/20/24) Anticoagulation: No, Last dose N/A  Creatinine:  Lab Results  Component Value Date   CREATININE 0.72 04/13/2024   CREATININE 1.23 (H) 07/03/2015   CREATININE 1.31 (H) 07/02/2015     Past Medical History:  Diagnosis Date   Aortic stenosis    Diabetes mellitus without complication (HCC) 2010   Diverticulitis 06/2015   High cholesterol 1987   Hypertension 2017   Obesity    Ureteral stone 06/2015    Past Surgical History:  Procedure Laterality Date   BREAST BIOPSY Left 11/10/2005   benign   CESAREAN SECTION  1978   1982 second    LAPAROSCOPIC HYSTERECTOMY Bilateral 2008   LEFT HEART CATH AND CORONARY ANGIOGRAPHY N/A 05/10/2024   Procedure: LEFT HEART CATH AND CORONARY ANGIOGRAPHY;  Surgeon: Verlin Lonni BIRCH, MD;  Location: [facility];  Service: Cardiovascular;  Laterality: N/A;   TONSILECTOMY, ADENOIDECTOMY, BILATERAL MYRINGOTOMY AND TUBES  1976    Social History:  Tobacco Use History[1]  Social History   Substance and Sexual Activity  Alcohol Use Yes   Alcohol/week: 2.0 standard drinks of alcohol   Types: 2 Glasses of wine per week   Comment: on the weekends     Allergies[2]  Medications: Asprin: *** Statin: *** Beta Blocker: *** Ace Inhibitor: *** Anti-Coagulation: ***  Current Outpatient Medications  Medication Sig Dispense Refill   Cholecalciferol (VITAMIN D) 50 MCG (2000 UT) tablet Take 2,000 Units by mouth daily.     ciclopirox (PENLAC) 8 % solution Apply 1 Application topically at bedtime.     Evolocumab  (REPATHA  SURECLICK) 140 MG/ML SOAJ Inject 140 mg into the skin every 14 (fourteen) days. 6 mL 3   glimepiride (AMARYL) 1 MG tablet Take 0.5 mg by mouth every morning.     irbesartan (AVAPRO) 150 MG tablet Take 150 mg by mouth daily.     metFORMIN (GLUCOPHAGE) 1000 MG tablet Take 1,000 mg by mouth 2 (two) times daily with a meal.     Multiple Vitamins-Minerals (MULTIVITAMIN WITH MINERALS) tablet Take  1 tablet by mouth daily.     Probiotic Product (PROBIOTIC DAILY) CAPS Take 1 capsule by mouth daily.     rosuvastatin  (CRESTOR ) 20 MG tablet Take 1 tablet (20 mg total) by mouth at bedtime. 90 tablet 3   Current Facility-Administered Medications  Medication Dose Route Frequency Provider Last Rate Last Admin   Study - VICTORION-1 PREVENT - inclisiran 300 mg/1.5mL or placebo SQ injection (PI-Stuckey)  300 mg Subcutaneous Q6 months    300 mg at 03/29/23 0831    (Not in a hospital admission)   Family History  Problem Relation Age of Onset   Hyperlipidemia Mother    High  Cholesterol Mother    Diabetes Mother    Cancer Mother    Coronary artery disease Mother    Cancer Father    Hypertension Sister    Kidney disease Sister    Diabetes Sister      Review of Systems:   ROS    Physical Exam: Pulse 73   Resp 18   Ht 5' (1.524 m)   SpO2 98% Comment: RA  BMI 34.57 kg/m  Physical Exam   Mild edema bilaterally  Diagnostic Studies & Laboratory data: Cardiac Studies & Procedures   ______________________________________________________________________________________________ CARDIAC CATHETERIZATION  CARDIAC CATHETERIZATION 05/10/2024  Conclusion No angiographic evidence of CAD  Continue workup for surgical AVR vs TAVR  Findings Coronary Findings Diagnostic  Dominance: Right  Left Anterior Descending Vessel is large.  Left Circumflex Vessel is large.  Right Coronary Artery Vessel is large.  Intervention  No interventions have been documented.     ECHOCARDIOGRAM  ECHOCARDIOGRAM COMPLETE 04/12/2024  Narrative ECHOCARDIOGRAM REPORT    Patient Name:   [name] Date of Exam: 04/12/2024 Medical Rec #:  [id number]         Height:       59.5 in Accession #:    [id number]        Weight:       171.0 lb Date of Birth:  1955/10/23        BSA:          1.736 m Patient Age:    69 years          BP:           166/101 mmHg Patient Gender: F                 HR:           68 bpm. Exam Location:  [address]  Procedure: 2D Echo, Cardiac Doppler, Color Doppler, 3D Echo and Strain Analysis (Both Spectral and Color Flow Doppler were utilized during procedure).  Indications:    Aortic Stenosis I35.0  History:        Patient has prior history of Echocardiogram examinations, most recent 09/28/2023. Risk Factors:Hypertension and Diabetes.  Sonographer:    Augustin Seals RDCS Referring Phys: [id number] CHRISTOPHER D MCALHANY  IMPRESSIONS   1. Left ventricular ejection fraction, by estimation, is 65 to 70%. Left ventricular  ejection fraction by 3D volume is 67 %. The left ventricle has normal function. The left ventricle has no regional wall motion abnormalities. Left ventricular diastolic parameters are consistent with Grade I diastolic dysfunction (impaired relaxation). The average left ventricular global longitudinal strain is -19.8 %. The global longitudinal strain is normal. 2. Right ventricular systolic function is normal. The right ventricular size is normal. There is normal pulmonary artery systolic pressure. The estimated right ventricular systolic pressure is 35.3 mmHg. 3. The mitral  valve is degenerative. Mild to moderate mitral valve regurgitation. 4. The aortic valve is tricuspid. There is severe calcifcation of the aortic valve. Aortic valve regurgitation is trivial. Severe aortic valve stenosis. Aortic valve area, by VTI measures 0.77 cm. Aortic valve mean gradient measures 62.0 mmHg. Aortic valve Vmax measures 4.97 m/s. Peak gradient 98.8 mmHg, DI 0.25. 5. Aortic dilatation noted. There is borderline dilatation of the ascending aorta, measuring 37 mm. 6. The inferior vena cava is normal in size with greater than 50% respiratory variability, suggesting right atrial pressure of 3 mmHg.  Comparison(s): Changes from prior study are noted. 09/28/2023: LVEF 65-70%, Severe AS - MG 39 mmHg.  FINDINGS Left Ventricle: Left ventricular ejection fraction, by estimation, is 65 to 70%. Left ventricular ejection fraction by 3D volume is 67 %. The left ventricle has normal function. The left ventricle has no regional wall motion abnormalities. The average left ventricular global longitudinal strain is -19.8 %. Strain was performed and the global longitudinal strain is normal. The left ventricular internal cavity size was normal in size. There is no left ventricular hypertrophy. Left ventricular diastolic parameters are consistent with Grade I diastolic dysfunction (impaired relaxation).  Right Ventricle: The right  ventricular size is normal. No increase in right ventricular wall thickness. Right ventricular systolic function is normal. There is normal pulmonary artery systolic pressure. The tricuspid regurgitant velocity is 2.84 m/s, and with an assumed right atrial pressure of 3 mmHg, the estimated right ventricular systolic pressure is 35.3 mmHg.  Left Atrium: Left atrial size was normal in size.  Right Atrium: Right atrial size was normal in size.  Pericardium: There is no evidence of pericardial effusion.  Mitral Valve: The mitral valve is degenerative in appearance. There is mild calcification of the anterior and posterior mitral valve leaflet(s). Mild to moderate mitral valve regurgitation, with centrally-directed jet.  Tricuspid Valve: The tricuspid valve is grossly normal. Tricuspid valve regurgitation is trivial.  Aortic Valve: The aortic valve is tricuspid. There is severe calcifcation of the aortic valve. Aortic valve regurgitation is trivial. Severe aortic stenosis is present. Aortic valve mean gradient measures 62.0 mmHg. Aortic valve peak gradient measures 98.8 mmHg. Aortic valve area, by VTI measures 0.77 cm.  Pulmonic Valve: The pulmonic valve was grossly normal. Pulmonic valve regurgitation is trivial.  Aorta: Aortic dilatation noted. There is borderline dilatation of the ascending aorta, measuring 37 mm.  Venous: The inferior vena cava is normal in size with greater than 50% respiratory variability, suggesting right atrial pressure of 3 mmHg.  IAS/Shunts: No atrial level shunt detected by color flow Doppler.  Additional Comments: 3D was performed not requiring image post processing on an independent workstation and was normal.   LEFT VENTRICLE PLAX 2D LVIDd:         4.30 cm LVIDs:         2.90 cm         2D Longitudinal LV PW:         1.00 cm         Strain LV IVS:        1.00 cm         2D Strain GLS   -20.9 % LVOT diam:     2.00 cm         (A4C): LV SV:         98               2D Strain GLS   -19.8 % LV SV Index:  56              (A3C): LVOT Area:     3.14 cm        2D Strain GLS   -18.8 % (A2C): 2D Strain GLS   -19.8 % Avg:  3D Volume EF LV 3D EF:    Left ventricul ar ejection fraction by 3D volume is 67 %.  3D Volume EF: 3D EF:        67 % LV EDV:       111 ml LV ESV:       37 ml LV SV:        74 ml  RIGHT VENTRICLE             IVC RV Basal diam:  3.00 cm     IVC diam: 1.50 cm RV Mid diam:    2.70 cm RV S prime:     12.70 cm/s  PULMONARY VEINS TAPSE (M-mode): 1.6 cm      A Reversal Velocity: 33.60 cm/s Diastolic Velocity:  62.70 cm/s S/D Velocity:        1.10 Systolic Velocity:   69.90 cm/s  LEFT ATRIUM             Index        RIGHT ATRIUM           Index LA diam:        4.00 cm 2.30 cm/m   RA Area:     14.10 cm LA Vol (A2C):   49.3 ml 28.41 ml/m  RA Volume:   33.50 ml  19.30 ml/m LA Vol (A4C):   32.8 ml 18.90 ml/m LA Biplane Vol: 42.4 ml 24.43 ml/m AORTIC VALVE AV Area (Vmax):    0.78 cm AV Area (Vmean):   0.72 cm AV Area (VTI):     0.77 cm AV Vmax:           497.00 cm/s AV Vmean:          355.500 cm/s AV VTI:            1.270 m AV Peak Grad:      98.8 mmHg AV Mean Grad:      62.0 mmHg LVOT Vmax:         124.00 cm/s LVOT Vmean:        81.700 cm/s LVOT VTI:          0.312 m LVOT/AV VTI ratio: 0.25  AORTA Ao Root diam: 2.30 cm Ao Asc diam:  3.70 cm  MITRAL VALVE                  TRICUSPID VALVE MV Area (PHT): 3.54 cm       TR Peak grad:   32.3 mmHg MV Decel Time: 214 msec       TR Vmax:        284.00 cm/s MR Peak grad:    137.4 mmHg MR Mean grad:    87.0 mmHg    SHUNTS MR Vmax:         586.00 cm/s  Systemic VTI:  0.31 m MR Vmean:        441.0 cm/s   Systemic Diam: 2.00 cm MR PISA:         1.57 cm MR PISA Eff ROA: 9 mm MR PISA Radius:  0.50 cm MV E velocity: 109.00 cm/s MV A velocity: 105.00 cm/s MV E/A ratio:  1.04  Vinie Maxcy MD Electronically signed by Vinie Maxcy MD Signature  Date/Time:  04/12/2024/11:13:55 AM    Final      CT SCANS  CT CORONARY MORPH W/CTA COR W/SCORE 05/25/2024  Addendum 05/28/2024 11:56 AM ADDENDUM REPORT: 05/28/2024 11:54  ADDENDUM: Under the addendum, it should state cardiac TAVR CT interpretation by the cardiologist is attached.   Electronically Signed By: Toribio Agreste M.D. On: 05/28/2024 11:54  Addendum 05/28/2024 11:46 AM ADDENDUM REPORT: 05/28/2024 11:44  EXAM: OVER-READ INTERPRETATION  CT CHEST  The following report is an over-read performed by radiologist Dr. Toribio Rajas [facility] on 05/28/2024. This over-read does not include interpretation of cardiac or coronary anatomy or pathology. The coronary calcium  score interpretation by the cardiologist is attached.  COMPARISON:  Cardiac CT 04/28/2022  FINDINGS: Calcified plaque over the aortic root and visualized thoracic aorta. Remaining extracardiac vascular structures are unremarkable. No significant mediastinal or hilar adenopathy. Remaining mediastinal structures are unremarkable.  Limited images through the lungs demonstrate no acute airspace process or effusion. No concerning pulmonary nodules/masses. Airways are normal.  Limited images through the upper abdomen are unremarkable.  No focal bony abnormality.  IMPRESSION: 1. No acute extracardiac abnormality. 2. Aortic atherosclerosis.  Aortic Atherosclerosis (ICD10-I70.0).   Electronically Signed By: Toribio Agreste M.D. On: 05/28/2024 11:44  Narrative CLINICAL DATA:  31F with severe aortic stenosis being evaluated for a TAVR procedure.  EXAM: Cardiac TAVR CT  TECHNIQUE: A non-contrast, gated CT scan was obtained with axial slices of 2.5 mm through the heart for aortic valve scoring. A 120 kV retrospective, gated, contrast cardiac scan was obtained. Gantry rotation speed was 230 msec and collimation was 0.63 mm. Nitroglycerin was not given. A delayed scan was obtained to exclude left  atrial appendage thrombus. The 3D dataset was reconstructed in systole with motion correction. The 3D data set was reconstructed in 5% intervals of the 0-95% of the R-R cycle. Systolic and diastolic phases were analyzed on a dedicated workstation using MPR, MIP, and VRT modes. The patient received 100 cc of contrast.  FINDINGS: Aortic Root:  Aortic valve: Tricuspid  Aortic valve calcium  score: 2821  Aortic annulus:  Diameter: 23mm x 18mm  Perimeter: 64mm  Area: 361mm^2  Calcifications: No calcifications  Coronary height: Min Left - 10mm; Min Right - 12mm  Sinotubular height: Left cusp - 17mm; Right cusp - 16mm; Noncoronary cusp - 17mm  LVOT (as measured 3 mm below the annulus):  Diameter: 24mm x 18mm  Area: 344mm^2  Calcifications: No calcifications  Aortic sinus width: Left cusp - 26mm; Right cusp - 24mm; Noncoronary cusp - 27mm  Sinotubular junction width: 27mm x 26mm  Optimum Fluoroscopic Angle for Delivery: RAO 6 CAU 10  Cardiac:  Right atrium: Normal size  Right ventricle: Normal size  Pulmonary arteries: Normal size  Pulmonary veins: Normal configuration  Left atrium: Mild enlargement  Left ventricle: Normal size  Pericardium: Normal thickness  Coronary arteries: Coronary calcium  score 214 (86th percentile)  IMPRESSION: 1. Tricuspid aortic valve with severe calcifications (AV calcium  score 2821)  2. Small aortic annulus measures 23mm x 18mm in diameter with perimeter 64mm and area 313 mm^2. No annular or LVOT calcifications. Annular measurements are suitable for delivery of 20mm Edwards Sapien 3 valve. Alternatively, annular measurements are right on the border between a 23mm and 26mm Evolut valve; however, sinus of valsalva diameter at right coronary cusp (24mm) is less than minimum recommended diameter for either Evolut valve. Suspect 20mm Sapien is likely best option. Recommend discussion at structural heart conference  3.  Low  coronary height to left main measuring 10mm  4. Optimum Fluoroscopic Angle for Delivery:  RAO 6 CAU 10  5. Coronary calcium  score 214 (86th percentile)  Electronically Signed: By: Lonni Nanas M.D. On: 05/25/2024 17:18   CT SCANS  CT CARDIAC SCORING (SELF PAY ONLY) 04/28/2022  Addendum 04/29/2022 10:24 AM ADDENDUM REPORT: 04/29/2022 10:21  EXAM: OVER-READ INTERPRETATION  CT CHEST  The following report is an over-read performed by radiologist Dr. Melinda Blietzof [facility] on 04/29/2022. This over-read does not include interpretation of cardiac or coronary anatomy or pathology. The interpretation by the cardiologist is attached.  COMPARISON:  None.  FINDINGS: Scout view is unremarkable. Atherosclerotic calcification of the aorta. Calcified right lower lobe granuloma.  IMPRESSION: 1. No acute extracardiac findings. 2.  Aortic atherosclerosis (ICD10-I70.0).   Electronically Signed By: Newell Eke M.D. On: 04/29/2022 10:21  Narrative CLINICAL DATA:  Cardiovascular Disease Risk stratification  EXAM: Coronary Calcium  Score  TECHNIQUE: A gated, non-contrast computed tomography scan of the heart was performed using 3mm slice thickness. Axial images were analyzed on a dedicated workstation. Calcium  scoring of the coronary arteries was performed using the Agatston method.  FINDINGS: Coronary arteries: Normal origins.  Coronary Calcium  Score:  Left main: 0  Left anterior descending artery: 57  Left circumflex artery: 21  Right coronary artery: 52  Total: 130  Percentile: 82  Pericardium: Normal.  Aorta: Normal caliber of ascending aorta. Aortic atherosclerosis noted, prominent in the aortic root and aortic arch. Calcification of aortic valve also noted.  Non-cardiac: See separate report from [facility].  IMPRESSION: Coronary calcium  score of 130. This was 82nd percentile for age-, race-, and sex-matched controls.  Aortic atherosclerosis, prominent in the aortic root and aortic arch. Calcification of aortic valve also noted.  RECOMMENDATIONS: Coronary artery calcium  (CAC) score is a strong predictor of incident coronary heart disease (CHD) and provides predictive information beyond traditional risk factors. CAC scoring is reasonable to use in the decision to withhold, postpone, or initiate statin therapy in intermediate-risk or selected borderline-risk asymptomatic adults (age 39-75 years and LDL-C >=70 to <190 mg/dL) who do not have diabetes or established atherosclerotic cardiovascular disease (ASCVD).* In intermediate-risk (10-year ASCVD risk >=7.5% to <20%) adults or selected borderline-risk (10-year ASCVD risk >=5% to <7.5%) adults in whom a CAC score is measured for the purpose of making a treatment decision the following recommendations have been made:  If CAC=0, it is reasonable to withhold statin therapy and reassess in 5 to 10 years, as long as higher risk conditions are absent (diabetes mellitus, family history of premature CHD in first degree relatives (males <55 years; females <65 years), cigarette smoking, or LDL >=190 mg/dL).  If CAC is 1 to 99, it is reasonable to initiate statin therapy for patients >=55 years of age.  If CAC is >=100 or >=75th percentile, it is reasonable to initiate statin therapy at any age.  Cardiology referral should be considered for patients with CAC scores >=400 or >=75th percentile.  *2018 AHA/ACC/AACVPR/AAPA/ABC/ACPM/ADA/AGS/APhA/ASPC/NLA/PCNA Guideline on the Management of Blood Cholesterol: A Report of the American College of Cardiology/American Heart Association Task Force on Clinical Practice Guidelines. J Am Coll Cardiol. 2019;73(24):3168-3209.  Shelda Lonni, MD  Electronically Signed: By: Shelda Lonni M.D. On: 04/29/2022 08:04      ______________________________________________________________________________________________     EKG: *** I have independently reviewed the above radiologic studies and discussed with the patient   Recent Lab Findings: Lab Results  Component Value Date   WBC 11.4 (H)  04/13/2024   HGB 11.9 04/13/2024   HCT 37.3 04/13/2024   PLT 294 04/13/2024   GLUCOSE 99 04/13/2024   CHOL 201 (H) 10/13/2023   TRIG 73 10/13/2023   HDL 54 10/13/2023   LDLCALC 134 (H) 10/13/2023   ALT 13 10/13/2023   AST 18 10/13/2023   NA 145 (H) 04/13/2024   K 4.6 04/13/2024   CL 105 04/13/2024   CREATININE 0.72 04/13/2024   BUN 19 04/13/2024   CO2 20 04/13/2024   HGBA1C 7.5 (H) 06/30/2015      Assessment / Plan:   69 y.o. female with ***     I  spent {CHL ONC TIME VISIT - DTPQU:[id number]} counseling the patient face to face.   Con RAMAN Jashayla Glatfelter 06/02/2024 4:16 PM           [1]  Social History Tobacco Use  Smoking Status Former  Smokeless Tobacco Never  [2]  Allergies Allergen Reactions   Penicillins Rash    Confirmed with patient no SOB, lip/tongue swelling   "
